# Patient Record
Sex: Female | Born: 1975 | Race: White | Hispanic: Yes | Marital: Single | Smoking: Never smoker
Health system: Southern US, Community
[De-identification: ages and names within clinical notes are randomized; demographics above are authoritative.]

## PROBLEM LIST (undated history)

## (undated) DIAGNOSIS — E669 Obesity, unspecified: Secondary | ICD-10-CM

## (undated) DIAGNOSIS — R7401 Elevation of levels of liver transaminase levels: Secondary | ICD-10-CM

## (undated) DIAGNOSIS — R7303 Prediabetes: Secondary | ICD-10-CM

## (undated) HISTORY — DX: Prediabetes: R73.03

## (undated) HISTORY — DX: Obesity, unspecified: E66.9

## (undated) HISTORY — DX: Elevation of levels of liver transaminase levels: R74.01

## (undated) HISTORY — PX: NO PAST SURGERIES: SHX2092

---

## 2009-01-10 ENCOUNTER — Ambulatory Visit: Payer: Self-pay | Admitting: Internal Medicine

## 2009-01-10 ENCOUNTER — Encounter (INDEPENDENT_AMBULATORY_CARE_PROVIDER_SITE_OTHER): Payer: Self-pay | Admitting: Internal Medicine

## 2009-01-10 LAB — CONVERTED CEMR LAB: Beta hcg, urine, semiquantitative: NEGATIVE

## 2009-01-13 ENCOUNTER — Encounter (INDEPENDENT_AMBULATORY_CARE_PROVIDER_SITE_OTHER): Payer: Self-pay | Admitting: Internal Medicine

## 2009-01-13 LAB — CONVERTED CEMR LAB
Nitrite: NEGATIVE
Specific Gravity, Urine: 1.023 (ref 1.005–1.0)
Urobilinogen, UA: 1 (ref 0.0–1.0)

## 2009-01-15 LAB — CONVERTED CEMR LAB
ALT: 12 U/L
AST: 8 U/L
Albumin: 4.6 g/dL
Alkaline Phosphatase: 90 U/L
BUN: 12 mg/dL
Basophils Absolute: 0 K/uL
Basophils Relative: 0 %
CO2: 25 meq/L
Calcium: 9.4 mg/dL
Chloride: 105 meq/L
Creatinine, Ser: 0.71 mg/dL
Eosinophils Absolute: 0.2 K/uL
Eosinophils Relative: 1 %
Free T4: 1.2 ng/dL
Glucose, Bld: 80 mg/dL
HCT: 40.4 %
Hemoglobin: 13.5 g/dL
Lymphocytes Relative: 17 %
Lymphs Abs: 2.5 K/uL
MCHC: 33.4 g/dL
MCV: 79.8 fL
Monocytes Absolute: 1.1 K/uL — ABNORMAL HIGH
Monocytes Relative: 7 %
Neutro Abs: 11 K/uL — ABNORMAL HIGH
Neutrophils Relative %: 74 %
Platelets: 229 K/uL
Potassium: 3.6 meq/L
RBC: 5.06 M/uL
RDW: 14.2 %
Sodium: 142 meq/L
TSH: 1.227 u[IU]/mL
Total Bilirubin: 0.3 mg/dL
Total Protein: 7.3 g/dL
WBC: 14.9 10*3/microliter — ABNORMAL HIGH

## 2009-05-12 ENCOUNTER — Ambulatory Visit: Payer: Self-pay | Admitting: Internal Medicine

## 2009-07-03 ENCOUNTER — Ambulatory Visit: Payer: Self-pay | Admitting: Internal Medicine

## 2009-07-03 ENCOUNTER — Encounter (INDEPENDENT_AMBULATORY_CARE_PROVIDER_SITE_OTHER): Payer: Self-pay | Admitting: Internal Medicine

## 2009-07-03 LAB — CONVERTED CEMR LAB
Eosinophils Absolute: 0.1 10*3/uL (ref 0.0–0.7)
HDL: 56 mg/dL (ref >39)
Lymphocytes Relative: 27 % (ref 12–46)
Lymphs Abs: 2.2 10*3/uL (ref 0.7–4.0)
Neutrophils Relative %: 65 % (ref 43–77)
Platelets: 218 10*3/uL (ref 150–400)
Triglycerides: 47 mg/dL (ref <150)
WBC: 8.1 10*3/uL (ref 4.0–10.5)

## 2009-07-17 ENCOUNTER — Ambulatory Visit: Payer: Self-pay | Admitting: Internal Medicine

## 2010-11-14 ENCOUNTER — Emergency Department (HOSPITAL_COMMUNITY)
Admission: EM | Admit: 2010-11-14 | Discharge: 2010-11-14 | Disposition: A | Discharge status: Home / Self Care | Payer: Self-pay | Attending: Emergency Medicine | Admitting: Emergency Medicine

## 2010-11-14 DIAGNOSIS — T6391XA Toxic effect of contact with unspecified venomous animal, accidental (unintentional), initial encounter: Secondary | ICD-10-CM | POA: Insufficient documentation

## 2010-11-14 DIAGNOSIS — T63391A Toxic effect of venom of other spider, accidental (unintentional), initial encounter: Secondary | ICD-10-CM | POA: Insufficient documentation

## 2010-11-14 DIAGNOSIS — L02419 Cutaneous abscess of limb, unspecified: Secondary | ICD-10-CM | POA: Insufficient documentation

## 2013-05-14 ENCOUNTER — Encounter: Payer: Self-pay | Admitting: *Deleted

## 2013-06-14 ENCOUNTER — Ambulatory Visit (INDEPENDENT_AMBULATORY_CARE_PROVIDER_SITE_OTHER): Payer: Self-pay | Admitting: Obstetrics & Gynecology

## 2013-06-14 ENCOUNTER — Encounter: Payer: Self-pay | Admitting: Obstetrics & Gynecology

## 2013-06-14 VITALS — BP 100/70 | HR 62 | Temp 97.7°F | Ht 63.0 in | Wt 136.0 lb

## 2013-06-14 DIAGNOSIS — N938 Other specified abnormal uterine and vaginal bleeding: Secondary | ICD-10-CM

## 2013-06-14 DIAGNOSIS — N949 Unspecified condition associated with female genital organs and menstrual cycle: Secondary | ICD-10-CM

## 2013-06-14 LAB — POCT PREGNANCY, URINE: Preg Test, Ur: NEGATIVE

## 2013-06-14 MED ORDER — MEGESTROL ACETATE 20 MG PO TABS: 40.0000 mg | ORAL_TABLET | Freq: Every day | ORAL | Status: DC

## 2013-06-14 NOTE — Progress Notes (Signed)
Subjective:     Patient ID: Heriberto AntiguaDora Hernandez Contreras, female   DOB: January 26, 1976, 38 y.o.   MRN: [id number]  HPI G1P1001.  Pt c/o irreg bleeding since Oct. She denies pain but her regular cycle was 5 day until this began. N o weight loss or constitutional sx.   History reviewed. No pertinent past medical history.  History reviewed. No pertinent past surgical history. No current outpatient prescriptions on file prior to visit.   No current facility-administered medications on file prior to visit.  Not on File History   Social History  . Marital Status: Single    Spouse Name: N/A    Number of Children: N/A  . Years of Education: N/A   Occupational History  . Not on file.   Social History Main Topics  . Smoking status: Never Smoker   . Smokeless tobacco: Not on file  . Alcohol Use: No  . Drug Use: No  . Sexual Activity: Yes    Birth Control/ Protection: Condom   Other Topics Concern  . Not on file   Social History Narrative  . No narrative on file      Review of Systems     Objective:   Physical Exam BP 100/70  Pulse 62  Temp(Src) 97.7 F (36.5 C)  Ht 5\' 3"  (1.6 m)  Wt 136 lb (61.689 kg)  BMI 24.10 kg/m2  LMP 05/28/2013 Pt in NAD Abd: soft, NT, ND GU: EGBUS: no lesions Vagina: no blood in vault Cervix: no lesion; no mucopurulent d/c Uterus: small ~10 weeks sized, mobile Adnexa: no masses; sl tender          Assessment:     DUB      Plan:     D/C OCP's TV sono Megace 40mg  daily If bleeding persists after 2 weeks may need to increase dose

## 2013-06-14 NOTE — Patient Instructions (Addendum)
Hemorragia uterina disfuncional (Dysfunctional Uterine Bleeding) Normalmente, los perodos menstruales comienzan en las jvenes de entre 11 y 17 aos. Un ciclo o perodo menstrual puede repetirse entre los 23 das y los 35 das y dura entre 1 y 7 das. Entre los 12 y los 14 das antes de que comience el ciclo menstrual, se produce la ovulacin (los ovarios producen vulos). Cuando cuente los periodos, hgalo desde el primer da del comienzo de la hemorragia del perodo anterior hasta el primer da de la hemorragia del perodo siguiente. La hemorragia uterina disfuncional (anormal) es diferente del perodo menstrual normal. Los perodos pueden comenzar antes o despus que lo habitual. Pueden ser menos abundantes, presentar cogulos, o ser ms abundantes. Puede tener hemorragias entre los perodos o saltear un perodo o ms. Puede tener hemorragia luego de mantener relaciones sexuales, despus de la menopausia o faltarle el perodo menstrual. CAUSAS  Embarazo (normal, aborto espontneo, embarazo ectpico).  DIU (dispositivo intrauterino, anticonceptivo)  Pldoras anticonceptivas  Tratamiento hormonal  La menopausia  Infeccin en el cervix.  Problemas de coagulacin.  Infecciones de la superficie interna del tero.  Endometriosis: la superficie interna del tero se desarrolla en la pelvis y otros rganos femeninos.  Adherencias (tejido cicatrizal) dentro del tero.  Obesidad o severa prdida de peso.  Plipos en el tero.  Cncer en el crvix, la vagina o el tero.  Quiste de ovarios o Sndrome ovrico poliqustico.  Otras enfermedades (diabetes, enfermedad de la tiroides, etc).  Fibromas en el tero (tumores no cancerosos).  Problemas con las hormonas femeninas.  Hiperplasia del endometrio: capa gruesa y clulas agrandadas dentro del tero.  Medicamentos que interfieren con la ovulacin.  Radiacin en la pelvis o el abdomen.  Quimioterapia. DIAGNSTICO  El mdico  analizar la historia de sus perodos menstruales, los medicamentos que toma, los cambios en el peso corporal, el estrs de la vida diaria y cualquier problema mdico que tenga.  El mdico realizar un examen fsico, incluyendo un examen plvico.  Tambin le solicitar anlisis para completar el diagnstico. Ellos son:  Papanicolau.  Anlisis de sangre.  Cultivos para descartar infecciones.  Tomografa computada.  Ultrasonido.  Histeroscopa.  Laparoscopa.  Resonancia magntica.  Histerosalpingografa.  Dilatacin y curetaje.  Biopsia de endometrio. TRATAMIENTO El tratamiento depender de la causa de la hemorragia.. El tratamiento consiste en:  Observacin de los perodos menstruales durante algunos meses.  Prescripcin de medicamentos como:  Antibiticos:  Hormonas.  Pldoras anticonceptivas  Retirar el DIU (dispositivo intrauterino, anticonceptivo).  Ciruga:  Dilatacin y curetaje (raspado y remocin de tejido de la zona interna del tero).  Laparoscopa (examen del interior del abdomen con un tubo con luz).  Ablacin uterina (destruccin de la membrana que cubre el tero con corriente elctrica, rayos lser, congelamiento o calor ).  Histeroscopa (examen del cuello y el tero con un tubo con luz).  Histerectoma (extirpacin del tero). INSTRUCCIONES PARA EL CUIDADO DOMICILIARIO  Si el profesional que la asiste le prescribe medicamentos, tmelos tal como se le indic. No cambie ni reemplace medicamentos sin consultarlo con el profesional.  Las hemorragias de larga duracin pueden traen como consecuencia un dficit de hierro. El profesional que lo asiste podr prescribirle hierro. Esto ayuda a reponer el hierro que el organismo pierde luego de una hemorragia abundante. Tome los medicamentos tal como se le indic.  No tome aspirina o medicamentos que la contengan u desde una semana antes del perodo menstrual ni durante el mismo. La aspirina puede hacer  que la hemorragia empeore.    Si necesita cambiar el apsito o el tampn mas de una vez cada 2 horas, permanezca en cama con los pies elevados y aplique una compresa fra en la zona baja del abdomen. Descanse todo lo que pueda hasta que la hemorragia se detenga.  Consuma alimentos balanceados. Coma alimentos ricos en hierro. Por ejemplo:  Vegetales verdes frescos.  Cereales integrales y cereales y panes con salvado.  Huevos.  Carnes.  Hgado.  No trate de perder peso hasta que la hemorragia anormal se detenga y los niveles de hierro en la sangre vuelvan a la normalidad. No levante pesos de ms de 10 libras (4.5 kg.) ni realice actividades extenuantes mientras tenga la hemorragia.  Durante un par de meses tome nota en un almanaque y marque el comienzo y el fin de su perodo menstrual y el tipo de sangrado (escaso, medio, abundante, gotas, cogulos o falta de perodo). El objetivo es que su mdico evale mejor el problema. SOLICITE ATENCIN MDICA SI:  Debe cambiar el apsito o el tampn ms de una vez cada hora.  Se siente mareada o dbil.  Tiene algn problema que pueda relacionarse con el medicamento que est tomando.  Siente dolor.  Desea retirar su DIU.  Quiere suspender o cambiar sus pldoras anticonceptivas u hormonas.  Tiene algn tipo de hemorragia anormal mencionada anteriormente.  Tiene ms de 16 aos y an no ha tenido su perodo menstrual.  Tiene 55 aos y an tiene perodos menstruales.  Tiene alguno de los sntomas mencionados anteriormente.  Aparece una erupcin cutnea. SOLICITE ATENCIN MDICA DE INMEDIATO SI:  La temperatura oral se eleva sin motivo por encima de 38,9 C (102 F).  Comienza a sentir escalofros.  Debe cambiar el apsito o el tampn ms de una vez cada hora.  Siente dolor abdominal.  Pierde el conocimiento, se desmaya. Document Released: 12/30/2004 Document Revised: 12/15/2011 [employer] Patient Information 2014 [employer].  

## 2013-06-26 ENCOUNTER — Ambulatory Visit (HOSPITAL_COMMUNITY)
Admission: RE | Admit: 2013-06-26 | Discharge: 2013-06-26 | Disposition: A | Discharge status: Home / Self Care | Payer: Self-pay | Source: Ambulatory Visit | Attending: Obstetrics & Gynecology | Admitting: Obstetrics & Gynecology

## 2013-06-26 DIAGNOSIS — N938 Other specified abnormal uterine and vaginal bleeding: Secondary | ICD-10-CM | POA: Insufficient documentation

## 2013-06-26 DIAGNOSIS — N949 Unspecified condition associated with female genital organs and menstrual cycle: Secondary | ICD-10-CM | POA: Insufficient documentation

## 2013-07-02 ENCOUNTER — Telehealth: Payer: Self-pay | Admitting: *Deleted

## 2013-07-02 NOTE — Telephone Encounter (Signed)
Message copied by Gerome ApleyZEYFANG, LINDA L on Mon Jul 02, 2013  4:05 PM ------      Message from: Willodean RosenthalHARRAWAY-SMITH, CAROLYN      Created: Thu Jun 28, 2013  3:10 PM       Please call pt.  Her sono was normal.  She should continue the Megace and f/u in 3 months as scheduled.            Thx,      clh-S ------

## 2013-07-02 NOTE — Telephone Encounter (Signed)
Called both patient and her spouse phone numbers, both are non- working numbers. Her number said at her request does not accept incoming calls.

## 2013-07-04 NOTE — Telephone Encounter (Signed)
Attempted to call pt unable to leave message. Sent certified letter.

## 2013-07-23 ENCOUNTER — Ambulatory Visit (INDEPENDENT_AMBULATORY_CARE_PROVIDER_SITE_OTHER): Payer: Self-pay | Admitting: Obstetrics & Gynecology

## 2013-07-23 DIAGNOSIS — N926 Irregular menstruation, unspecified: Secondary | ICD-10-CM

## 2013-07-23 DIAGNOSIS — N939 Abnormal uterine and vaginal bleeding, unspecified: Secondary | ICD-10-CM

## 2013-07-23 NOTE — Progress Notes (Signed)
Patient came in because she was told to make appt and come in if she kept bleeding. Patient reports no change since last visit here- finally stopped bleeding 2 days ago. Discussed with Anyanwu, patient can continue megace and follow up in June as previously discussed. If bleeding picks up patient can increase megace to two pills twice daily for a total of 80mg . Discussed Dr Mont DuttonAnyanwu's recommendations with patient. Patient verbalized understanding to all and knows to call if bleeding becomes heavy again. Patient not seen by doctor today

## 2013-07-24 ENCOUNTER — Encounter: Payer: Self-pay | Admitting: Obstetrics & Gynecology

## 2013-07-24 DIAGNOSIS — N939 Abnormal uterine and vaginal bleeding, unspecified: Secondary | ICD-10-CM | POA: Insufficient documentation

## 2013-07-24 NOTE — Patient Instructions (Signed)
Regrese a la clinica cuando tenga su cita. Si tiene problemas o preguntas, llama a la clinica o vaya a la sala [facility] emergencia al [facility].    

## 2013-08-29 ENCOUNTER — Encounter: Payer: Self-pay | Admitting: General Practice

## 2013-08-31 ENCOUNTER — Encounter: Payer: Self-pay | Admitting: General Practice

## 2013-12-03 ENCOUNTER — Emergency Department (HOSPITAL_COMMUNITY): Payer: Self-pay

## 2013-12-03 ENCOUNTER — Emergency Department (HOSPITAL_COMMUNITY)
Admission: EM | Admit: 2013-12-03 | Discharge: 2013-12-03 | Disposition: A | Discharge status: Home / Self Care | Payer: Self-pay | Attending: Emergency Medicine | Admitting: Emergency Medicine

## 2013-12-03 ENCOUNTER — Encounter (HOSPITAL_COMMUNITY): Payer: Self-pay | Admitting: Emergency Medicine

## 2013-12-03 DIAGNOSIS — R300 Dysuria: Secondary | ICD-10-CM | POA: Insufficient documentation

## 2013-12-03 DIAGNOSIS — R1030 Lower abdominal pain, unspecified: Secondary | ICD-10-CM

## 2013-12-03 DIAGNOSIS — Z3490 Encounter for supervision of normal pregnancy, unspecified, unspecified trimester: Secondary | ICD-10-CM

## 2013-12-03 DIAGNOSIS — O9989 Other specified diseases and conditions complicating pregnancy, childbirth and the puerperium: Secondary | ICD-10-CM | POA: Insufficient documentation

## 2013-12-03 DIAGNOSIS — R197 Diarrhea, unspecified: Secondary | ICD-10-CM | POA: Insufficient documentation

## 2013-12-03 DIAGNOSIS — R109 Unspecified abdominal pain: Secondary | ICD-10-CM | POA: Insufficient documentation

## 2013-12-03 LAB — CBC WITH DIFFERENTIAL/PLATELET
BASOS PCT: 0 % (ref 0–1)
Basophils Absolute: 0 10*3/uL (ref 0.0–0.1)
EOS ABS: 0.2 10*3/uL (ref 0.0–0.7)
Eosinophils Relative: 2 % (ref 0–5)
HEMATOCRIT: 35.8 % — AB (ref 36.0–46.0)
HEMOGLOBIN: 12.3 g/dL (ref 12.0–15.0)
LYMPHS ABS: 2.3 10*3/uL (ref 0.7–4.0)
Lymphocytes Relative: 22 % (ref 12–46)
MCH: 26.7 pg (ref 26.0–34.0)
MCHC: 34.4 g/dL (ref 30.0–36.0)
MCV: 77.8 fL — AB (ref 78.0–100.0)
MONO ABS: 0.8 10*3/uL (ref 0.1–1.0)
Monocytes Relative: 8 % (ref 3–12)
Neutro Abs: 7.4 10*3/uL (ref 1.7–7.7)
Neutrophils Relative %: 68 % (ref 43–77)
Platelets: 211 10*3/uL (ref 150–400)
RBC: 4.6 MIL/uL (ref 3.87–5.11)
RDW: 14.1 % (ref 11.5–15.5)
WBC: 10.8 10*3/uL — ABNORMAL HIGH (ref 4.0–10.5)

## 2013-12-03 LAB — COMPREHENSIVE METABOLIC PANEL
ALBUMIN: 3.2 g/dL — AB (ref 3.5–5.2)
ALK PHOS: 78 U/L (ref 39–117)
ALT: 11 U/L (ref 0–35)
AST: 8 U/L (ref 0–37)
Anion gap: 13 (ref 5–15)
BUN: 8 mg/dL (ref 6–23)
CO2: 22 mEq/L (ref 19–32)
Calcium: 8.9 mg/dL (ref 8.4–10.5)
Chloride: 104 mEq/L (ref 96–112)
Creatinine, Ser: 0.56 mg/dL (ref 0.50–1.10)
GFR calc non Af Amer: >90 mL/min (ref >90)
GLUCOSE: 117 mg/dL — AB (ref 70–99)
POTASSIUM: 3.5 meq/L — AB (ref 3.7–5.3)
Sodium: 139 mEq/L (ref 137–147)
TOTAL PROTEIN: 6.9 g/dL (ref 6.0–8.3)

## 2013-12-03 LAB — URINALYSIS, ROUTINE W REFLEX MICROSCOPIC
BILIRUBIN URINE: NEGATIVE
Glucose, UA: NEGATIVE mg/dL
Hgb urine dipstick: NEGATIVE
KETONES UR: NEGATIVE mg/dL
LEUKOCYTES UA: NEGATIVE
Nitrite: NEGATIVE
Protein, ur: NEGATIVE mg/dL
Specific Gravity, Urine: 1.02 (ref 1.005–1.030)
UROBILINOGEN UA: 0.2 mg/dL (ref 0.0–1.0)
pH: 6 (ref 5.0–8.0)

## 2013-12-03 LAB — POC URINE PREG, ED
Preg Test, Ur: POSITIVE — AB
Preg Test, Ur: POSITIVE — AB

## 2013-12-03 MED ORDER — ACETAMINOPHEN 500 MG PO TABS: 500.0000 mg | ORAL_TABLET | Freq: Four times a day (QID) | ORAL | Status: DC | PRN

## 2013-12-03 MED ORDER — ACETAMINOPHEN 325 MG PO TABS
325.0000 mg | ORAL_TABLET | Freq: Once | ORAL | Status: AC
  Administered 2013-12-03: 325 mg via ORAL
  Filled 2013-12-03: qty 1

## 2013-12-03 NOTE — Discharge Instructions (Signed)
Please follow up with your primary care physician in 1-2 days. If you do not have one please call the [facility] number listed above. Please follow up with the Ob/Gyn tomorrow at your scheduled appointment. Please read all discharge instructions and return precautions.    Dolor abdominal en las mujeres (Abdominal Pain, Women) El dolor abdominal (en el estmago, la pelvis o el vientre) puede tener muchas causas. Es importante que le informe a su mdico:  La ubicacin del Engineer, mining.  Viene y se va, o persiste todo el tiempo?  Hay situaciones que Location manager (comer ciertos alimentos, la actividad fsica)?  Tiene otros sntomas asociados al dolor (fiebre, nuseas, vmitos, diarrea)? Todo es de gran ayuda cuando se trata de hallar la causa del dolor. CAUSAS  Estmago: Infecciones por virus o bacterias, o lcera.  Intestino: Apendicitis (apndice inflamado), ileitis regional (enfermedad de Crohn), colitis ulcerosa (colon inflamado), sndrome del colon irritable, diverticulitis (inflamacin de los divertculos del colon) o cncer de estmago oo intestino.  Enfermedades de la vescula biliar o clculos.  Enfermedades renales, clculos o infecciones en el rin.  Infeccin o cncer del pncreas.  Fibromialgia (trastorno doloroso)  Enfermedades de los rganos femeninos:  Uterus: tero: fibroma (tumor no canceroso) o infeccin  Trompas de [place]op[place]: infeccin o embarazo ectpico  En los ovarios, quistes o tumores.  Adherencias plvicas (tejido cicatrizal).  Endometriosis (el tejido que cubre el tero se desarrolla en la pelvis y los rganos plvicos).  Sndrome de Agricultural engineer (los rganos femeninos se llenan de sangre antes del periodo menstrual(  Dolor durante el periodo menstrual.  Dolor durante la ovulacin (al producir vulos).  Dolor al usar el DIU (dispositivo intrauterino para el control de la natalidad)  Psychologist, clinical los rganos  femeninos.  Dolor funcional (no est originado en una enfermedad, puede mejorar sin tratamiento).  Dolor de origen psicolgico  Depresin. DIAGNSTICO Su mdico decidir la gravedad del dolor a travs del examen fsico  Anlisis de sangre  Radiografas  Ecografas  TC (tomografa computada, tipo especial de radiografas).  IMR (resonancia magntica)  Cultivos, en el caso una infeccin  Colon por enema de bario (se inserta una sustancia de contraste en el intestino grueso para mejorar la observacin con rayos X.)  Colonoscopa (observacin del intestino con un tubo luminoso).  Laparoscopa (examen del interior del abdomen con un tubo que tiene [employer]).  Ciruga exploratoria abdominal mayor (se observa el abdomen realizando una gran incisin). TRATAMIENTO El tratamiento depender de la causa del problema.   Muchos de estos casos pueden controlarse y tratarse en casa.  Medicamentos de venta libre indicados por el mdico.  Medicamentos con receta.  Antibiticos, en caso de infeccin  Pldoras anticonceptivas, en el caso de perodos dolorosos o dolor al ovular.  Tratamiento hormonal, para la endometriosis  Inyecciones para bloqueo nervioso selectivo.  Fisioterapia.  Antidepresivos.  Consejos por parte de un psclogo o psiquiatra.  Ciruga mayor o menor. INSTRUCCIONES PARA EL CUIDADO DOMICILIARIO  No tome ni administre laxantes a menos que se lo haya indicado su mdico.  Tome analgsicos de venta libre slo si se lo ha indicado el profesional que lo asiste. No tome aspirina, ya que puede causar [employer] o hemorragias.  Consuma una dieta lquida (caldo o agua) segn lo indicado por el mdico. Progrese lentamente a una dieta blanda, segn la tolerancia, si el dolor se relaciona con el estmago o el intestino.  Tenga un termmetro y Automotive engineer varias  veces al da.  Haga reposo en la cama y [place], si esto Research scientist (life sciences).  Evite las  relaciones sexuales, Counsellor.  Evite las situaciones estresantes.  Cumpla con las visitas y los anlisis de control, segn las indicaciones de su mdico.  Si el dolor no se [place] con los medicamentos o la [place], [place] tratar con:  Acupuntura.  Ejercicios de relajacin (yoga, meditacin).  Terapia grupal.  Psicoterapia. SOLICITE ATENCIN MDICA SI:  Nota que ciertos Pharmacist, community de [place].  El tratamiento indicado para Arboriculturist no Marketing executive.  Necesita analgsicos ms fuertes.  Quiere que le retiren el DIU.  Si se siente confundido o desfalleciente.  Presenta nuseas o vmitos.  Aparece una erupcin cutnea.  Sufre efectos adversos o una reaccin alrgica debido a los medicamentos que toma. SOLICITE ATENCIN MDICA DE INMEDIATO SI:  El dolor persiste o se agrava.  Tiene fiebre.  Siente el dolor slo en algunos sectores del abdomen. Si se localiza en la zona derecha, posiblemente podra tratarse de apendicitis. En un adulto, si se localiza en la regin inferior izquierda del abdomen, podra tratarse de colitis o diverticulitis.  Hay sangre en las heces (deposiciones de color rojo brillante o negro alquitranado), con o sin vmitos.  Usted presenta sangre en la orina.  Siente escalofros con o sin fiebre.  Se desmaya. ASEGRESE QUE:   Comprende estas instrucciones.  Controlar su enfermedad.  Solicitar ayuda de inmediato si no mejora o si empeora. Document Released: 07/08/2008 Document Revised: 06/14/2011 [facility] Patient Information 2015 [place], [place]. This information is not intended to replace advice given to you by your health care provider. Make sure you discuss any questions you have with your health care provider.

## 2013-12-03 NOTE — ED Provider Notes (Signed)
CSN: [id number]     Arrival date & time 12/03/13  1837 History   First MD Initiated Contact with Patient 12/03/13 2000     Chief Complaint  Patient presents with  . Abdominal Pain     (Consider location/radiation/quality/duration/timing/severity/associated sxs/prior Treatment) HPI Comments: Patient is a G29 P39 38 year old female presented to the emergency department for acute onset lower abdominal pain that began one hour prior to arrival. Patient described her pain as sharp cramping pressure that has improved off since having an episode of nonbloody diarrhea. Also endorses mild dysuria. Denies any fevers, chills, nausea, vomiting, vaginal bleeding or discharge. No abdominal surgical history. Patient is scheduled for OB/GYN followup tomorrow morning.  Patient is a 39 y.o. female presenting with abdominal pain. The history is provided by the patient. The history is limited by a language barrier. A language interpreter was used.  Abdominal Pain Associated symptoms: diarrhea   Associated symptoms: no chills, no fever, no nausea and no vomiting     History reviewed. No pertinent past medical history. History reviewed. No pertinent past surgical history. Family History  Problem Relation Age of Onset  . Hyperlipidemia Father    History  Substance Use Topics  . Smoking status: Never Smoker   . Smokeless tobacco: Not on file  . Alcohol Use: No   OB History   Grav Para Term Preterm Abortions TAB SAB Ect Mult Living   4              Review of Systems  Constitutional: Negative for fever and chills.  Gastrointestinal: Positive for abdominal pain and diarrhea. Negative for nausea and vomiting.  Genitourinary: Negative.   All other systems reviewed and are negative.     Allergies  Review of patient's allergies indicates no known allergies.  Home Medications   Prior to Admission medications   Medication Sig Start Date End Date Taking? Authorizing Provider  acetaminophen (TYLENOL)  500 MG tablet Take 1 tablet (500 mg total) by mouth every 6 (six) hours as needed. 12/03/13   Nancylee Gaines L Borna Wessinger, PA-C   BP 104/62  Pulse 72  Temp(Src) 97.9 F (36.6 C) (Oral)  Resp 17  Wt 141 lb 8 oz (64.184 kg)  SpO2 99%  LMP 05/28/2013 Physical Exam  Nursing note and vitals reviewed. Constitutional: She is oriented to person, place, and time. She appears well-developed and well-nourished. No distress.  HENT:  Head: Normocephalic and atraumatic.  Right Ear: External ear normal.  Left Ear: External ear normal.  Nose: Nose normal.  Mouth/Throat: Oropharynx is clear and moist. No oropharyngeal exudate.  Eyes: Conjunctivae are normal.  Neck: Normal range of motion. Neck supple.  Cardiovascular: Normal rate, regular rhythm and normal heart sounds.   Pulmonary/Chest: Effort normal and breath sounds normal. No respiratory distress.  Abdominal: Soft. Bowel sounds are normal. She exhibits no distension. There is no tenderness. There is no rebound and no guarding.  Musculoskeletal: Normal range of motion.  Neurological: She is alert and oriented to person, place, and time.  Skin: Skin is warm and dry. She is not diaphoretic.  Psychiatric: She has a normal mood and affect.    ED Course  Procedures (including critical care time) Medications  acetaminophen (TYLENOL) tablet 325 mg (325 mg Oral Given 12/03/13 2021)    Labs Review Labs Reviewed  CBC WITH DIFFERENTIAL - Abnormal; Notable for the following:    WBC 10.8 (*)    HCT 35.8 (*)    MCV 77.8 (*)    All  other components within normal limits  COMPREHENSIVE METABOLIC PANEL - Abnormal; Notable for the following:    Potassium 3.5 (*)    Glucose, Bld 117 (*)    Albumin 3.2 (*)    Total Bilirubin <0.2 (*)    All other components within normal limits  POC URINE PREG, ED - Abnormal; Notable for the following:    Preg Test, Ur POSITIVE (*)    All other components within normal limits  POC URINE PREG, ED - Abnormal; Notable for  the following:    Preg Test, Ur POSITIVE (*)    All other components within normal limits  URINE CULTURE  URINALYSIS, ROUTINE W REFLEX MICROSCOPIC    Imaging Review US Ob Comp + 14 Wk  12/03/2013   CLINICAL DATA:  Abdominal pain, 14 weeks 5 days pregnant.  EXAM: LIMITED OBSTETRIC ULTRASOUND  FINDINGS: Number of Fetuses: 1  Heart Rate:  153 bpm  Movement: Yes  Presentation: Variable  Placental Location: Anterior  Previa: No  Amniotic Fluid (Subjective):  Within normal limits.  BPD:  27.6cm 14w  6d  MATERNAL FINDINGS:  Cervix:  Appears closed.  Uterus/Adnexae:  Ovaries not visualized.  IMPRESSION: Intrauterine pregnancy with estimated age of 14 weeks 6 days by BPD.  Ovaries not visualized.  This exam is performed on an emergent basis and does not comprehensively evaluate fetal size, dating, or anatomy; follow-up complete OB US should be considered if further fetal assessment is warranted.   Electronically Signed   By: Jearld Lesch M.D.   On: 12/03/2013 21:29     EKG Interpretation None      MDM   Final diagnoses:  Lower abdominal pain  Pregnancy   Filed Vitals:   12/03/13 2200  BP: 104/62  Pulse: 72  Temp:   Resp: 17   Afebrile, NAD, non-toxic appearing, AAOx4.   Abdomen is soft, Nontender, nondistended. Denies any pelvic pain, vaginal bleeding or discharge. I have reviewed nursing notes, vital signs, and all appropriate lab and imaging results for this patient. Patient is pain-free at this time. Symptoms likely related to episode of diarrhea earlier. 5 patient to keep OB/GYN followup appointment tomorrow as prescribed. Return precautions were discussed. Patient is agreeable to plan. Patient stable at time of discharge.     Jeannetta Ellis, PA-C 12/03/13 2248

## 2013-12-03 NOTE — ED Notes (Signed)
Pt reports having lower abd pain x 1 hour. Pt is pregnant but unsure when her last period was, she is estimating being 3-4 months pregnant. Denies any vaginal bleeding or discharge but is having  Mild pain with urination. Having diarrhea, denies n/v.

## 2013-12-04 NOTE — ED Provider Notes (Signed)
Medical screening examination/treatment/procedure(s) were performed by non-physician practitioner and as supervising physician I was immediately available for consultation/collaboration.   EKG Interpretation None        Gilda Crease, MD 12/04/13 [phone]

## 2013-12-05 LAB — URINE CULTURE
Colony Count: NO GROWTH
Culture: NO GROWTH

## 2014-01-17 ENCOUNTER — Other Ambulatory Visit (HOSPITAL_COMMUNITY): Payer: Self-pay | Admitting: Physician Assistant

## 2014-01-17 DIAGNOSIS — Z3689 Encounter for other specified antenatal screening: Secondary | ICD-10-CM

## 2014-01-17 LAB — OB RESULTS CONSOLE RPR: RPR: NONREACTIVE

## 2014-01-17 LAB — OB RESULTS CONSOLE GC/CHLAMYDIA
CHLAMYDIA, DNA PROBE: NEGATIVE
Gonorrhea: NEGATIVE

## 2014-01-17 LAB — OB RESULTS CONSOLE ABO/RH: RH Type: POSITIVE

## 2014-01-17 LAB — OB RESULTS CONSOLE HEPATITIS B SURFACE ANTIGEN: Hepatitis B Surface Ag: NEGATIVE

## 2014-01-17 LAB — OB RESULTS CONSOLE RUBELLA ANTIBODY, IGM: RUBELLA: IMMUNE

## 2014-01-17 LAB — OB RESULTS CONSOLE HIV ANTIBODY (ROUTINE TESTING): HIV: NONREACTIVE

## 2014-01-17 LAB — OB RESULTS CONSOLE ANTIBODY SCREEN: Antibody Screen: NEGATIVE

## 2014-01-22 ENCOUNTER — Other Ambulatory Visit (HOSPITAL_COMMUNITY): Payer: Self-pay | Admitting: Physician Assistant

## 2014-01-22 ENCOUNTER — Ambulatory Visit (HOSPITAL_COMMUNITY)
Admission: RE | Admit: 2014-01-22 | Discharge: 2014-01-22 | Disposition: A | Discharge status: Home / Self Care | Payer: Self-pay | Source: Ambulatory Visit | Attending: Physician Assistant | Admitting: Physician Assistant

## 2014-01-22 DIAGNOSIS — O09522 Supervision of elderly multigravida, second trimester: Secondary | ICD-10-CM | POA: Insufficient documentation

## 2014-01-22 DIAGNOSIS — Z3A00 Weeks of gestation of pregnancy not specified: Secondary | ICD-10-CM | POA: Insufficient documentation

## 2014-01-22 DIAGNOSIS — Z3689 Encounter for other specified antenatal screening: Secondary | ICD-10-CM

## 2014-01-22 DIAGNOSIS — Z36 Encounter for antenatal screening of mother: Secondary | ICD-10-CM | POA: Insufficient documentation

## 2014-02-04 ENCOUNTER — Encounter (HOSPITAL_COMMUNITY): Payer: Self-pay | Admitting: Emergency Medicine

## 2014-04-05 NOTE — L&D Delivery Note (Signed)
Delivery Note At 1:43 AM a viable and healthy female was delivered via  (Presentation: ROA;  ).  APGAR: 9, 9; weight 3515g.   Placenta status: Intact, Spontaneous.  Cord: 3 vessel with the following complications: none.   Anesthesia:  epidural Episiotomy:  none Lacerations:  none Suture Repair: n/a Est. Blood Loss (mL):  250  Healthy female infant delivered over intact perineum, vigorous cry, placed on mothers abdomen.  Mom to postpartum.  Baby to Couplet care / Skin to Skin.  Beverely Lowdamo, Drew Lips 05/25/2014, 1:54 AM

## 2014-05-02 LAB — OB RESULTS CONSOLE GBS: GBS: NEGATIVE

## 2014-05-24 ENCOUNTER — Inpatient Hospital Stay (HOSPITAL_COMMUNITY): Payer: Medicaid Other | Admitting: Anesthesiology

## 2014-05-24 ENCOUNTER — Inpatient Hospital Stay (HOSPITAL_COMMUNITY)
Admission: AD | Admit: 2014-05-24 | Discharge: 2014-05-26 | DRG: 775 | Disposition: A | Discharge status: Home / Self Care | Payer: Medicaid Other | Source: Ambulatory Visit | Attending: Obstetrics & Gynecology | Admitting: Obstetrics & Gynecology

## 2014-05-24 ENCOUNTER — Encounter (HOSPITAL_COMMUNITY): Payer: Self-pay | Admitting: *Deleted

## 2014-05-24 DIAGNOSIS — IMO0001 Reserved for inherently not codable concepts without codable children: Secondary | ICD-10-CM

## 2014-05-24 DIAGNOSIS — Z3483 Encounter for supervision of other normal pregnancy, third trimester: Secondary | ICD-10-CM | POA: Diagnosis present

## 2014-05-24 DIAGNOSIS — Z3A39 39 weeks gestation of pregnancy: Secondary | ICD-10-CM | POA: Diagnosis present

## 2014-05-24 LAB — CBC
HCT: 39.9 % (ref 36.0–46.0)
Hemoglobin: 13.7 g/dL (ref 12.0–15.0)
MCH: 28.2 pg (ref 26.0–34.0)
MCHC: 34.3 g/dL (ref 30.0–36.0)
MCV: 82.1 fL (ref 78.0–100.0)
PLATELETS: 177 10*3/uL (ref 150–400)
RBC: 4.86 MIL/uL (ref 3.87–5.11)
RDW: 15.6 % — ABNORMAL HIGH (ref 11.5–15.5)
WBC: 19.7 10*3/uL — ABNORMAL HIGH (ref 4.0–10.5)

## 2014-05-24 LAB — TYPE AND SCREEN
ABO/RH(D): O POS
ANTIBODY SCREEN: NEGATIVE

## 2014-05-24 MED ORDER — FENTANYL 2.5 MCG/ML BUPIVACAINE 1/10 % EPIDURAL INFUSION (WH - ANES)
INTRAMUSCULAR | Status: DC | PRN
  Administered 2014-05-24: 14 mL/h via EPIDURAL

## 2014-05-24 MED ORDER — LIDOCAINE HCL (PF) 1 % IJ SOLN
30.0000 mL | INTRAMUSCULAR | Status: DC | PRN
  Filled 2014-05-24: qty 30

## 2014-05-24 MED ORDER — OXYTOCIN 40 UNITS IN LACTATED RINGERS INFUSION - SIMPLE MED
62.5000 mL/h | INTRAVENOUS | Status: DC
  Filled 2014-05-24: qty 1000

## 2014-05-24 MED ORDER — LACTATED RINGERS IV SOLN
500.0000 mL | Freq: Once | INTRAVENOUS | Status: AC
  Administered 2014-05-24: 500 mL via INTRAVENOUS

## 2014-05-24 MED ORDER — OXYCODONE-ACETAMINOPHEN 5-325 MG PO TABS: 2.0000 | ORAL_TABLET | ORAL | Status: DC | PRN

## 2014-05-24 MED ORDER — EPHEDRINE 5 MG/ML INJ
10.0000 mg | INTRAVENOUS | Status: DC | PRN
  Filled 2014-05-24: qty 2

## 2014-05-24 MED ORDER — FENTANYL CITRATE 0.05 MG/ML IJ SOLN
100.0000 ug | INTRAMUSCULAR | Status: DC | PRN
  Administered 2014-05-24 (×2): 100 ug via INTRAVENOUS
  Filled 2014-05-24 (×2): qty 2

## 2014-05-24 MED ORDER — PHENYLEPHRINE 40 MCG/ML (10ML) SYRINGE FOR IV PUSH (FOR BLOOD PRESSURE SUPPORT)
80.0000 ug | PREFILLED_SYRINGE | INTRAVENOUS | Status: DC | PRN
  Filled 2014-05-24: qty 2

## 2014-05-24 MED ORDER — PHENYLEPHRINE 40 MCG/ML (10ML) SYRINGE FOR IV PUSH (FOR BLOOD PRESSURE SUPPORT)
80.0000 ug | PREFILLED_SYRINGE | INTRAVENOUS | Status: DC | PRN
  Filled 2014-05-24: qty 2
  Filled 2014-05-24: qty 20

## 2014-05-24 MED ORDER — LACTATED RINGERS IV SOLN
INTRAVENOUS | Status: DC
  Administered 2014-05-24 (×2): via INTRAVENOUS

## 2014-05-24 MED ORDER — DIPHENHYDRAMINE HCL 50 MG/ML IJ SOLN: 12.5000 mg | INTRAMUSCULAR | Status: DC | PRN

## 2014-05-24 MED ORDER — FENTANYL 2.5 MCG/ML BUPIVACAINE 1/10 % EPIDURAL INFUSION (WH - ANES)
14.0000 mL/h | INTRAMUSCULAR | Status: DC | PRN
  Administered 2014-05-24: 14 mL/h via EPIDURAL
  Filled 2014-05-24: qty 125

## 2014-05-24 MED ORDER — ONDANSETRON HCL 4 MG/2ML IJ SOLN: 4.0000 mg | Freq: Four times a day (QID) | INTRAMUSCULAR | Status: DC | PRN

## 2014-05-24 MED ORDER — OXYCODONE-ACETAMINOPHEN 5-325 MG PO TABS: 1.0000 | ORAL_TABLET | ORAL | Status: DC | PRN

## 2014-05-24 MED ORDER — LACTATED RINGERS IV SOLN: 500.0000 mL | INTRAVENOUS | Status: DC | PRN

## 2014-05-24 MED ORDER — ACETAMINOPHEN 325 MG PO TABS: 650.0000 mg | ORAL_TABLET | ORAL | Status: DC | PRN

## 2014-05-24 MED ORDER — LIDOCAINE HCL (PF) 1 % IJ SOLN
INTRAMUSCULAR | Status: DC | PRN
  Administered 2014-05-24 (×2): 5 mL
  Administered 2014-05-24: 3 mL

## 2014-05-24 MED ORDER — OXYTOCIN BOLUS FROM INFUSION
500.0000 mL | INTRAVENOUS | Status: DC
  Administered 2014-05-25: 500 mL via INTRAVENOUS

## 2014-05-24 MED ORDER — CITRIC ACID-SODIUM CITRATE 334-500 MG/5ML PO SOLN: 30.0000 mL | ORAL | Status: DC | PRN

## 2014-05-24 NOTE — Anesthesia Procedure Notes (Signed)
Epidural Patient location during procedure: OB  Staffing Anesthesiologist: Johnatan Baskette Performed by: anesthesiologist   Preanesthetic Checklist Completed: patient identified, site marked, surgical consent, pre-op evaluation, timeout performed, IV checked, risks and benefits discussed and monitors and equipment checked  Epidural Patient position: sitting Prep: ChloraPrep Patient monitoring: heart rate, continuous pulse ox and blood pressure Approach: midline Location: L3-L4 Injection technique: LOR saline  Needle:  Needle type: Tuohy  Needle gauge: 17 G Needle length: 9 cm and 9 Needle insertion depth: 5 cm Catheter type: closed end flexible Catheter size: 20 Guage Catheter at skin depth: 10 cm Test dose: negative  Assessment Events: blood not aspirated, injection not painful, no injection resistance, negative IV test and no paresthesia  Additional Notes   Patient tolerated the insertion well without complications.   

## 2014-05-24 NOTE — Progress Notes (Signed)
I assisted RN with some questions, by Viria Alvarez Spanish Interpreter °

## 2014-05-24 NOTE — H&P (Signed)
Nicole Page is a 39 y.o. female [phone] presenting for eval of ctx. Denies leaking or bldg. Reports +FM. [facility] and has been remarkable for 1) hx LGA (10lb) 2) AMA 3) ? Fetal aortic stenosis w/ nl fetal echo  3) language barrier  History OB History    Gravida Para Term Preterm AB TAB SAB Ectopic Multiple Living   7 3 3  0 0 0 0 0 0 3     History reviewed. No pertinent past medical history. Past Surgical History  Procedure Laterality Date  . No past surgeries     Family History: family history includes Hyperlipidemia in her father. Social History:  reports that she has never smoked. She does not have any smokeless tobacco history on file. She reports that she does not drink alcohol or use illicit drugs.   Prenatal Transfer Tool  Maternal Diabetes: No Genetic Screening: Declined- too late Maternal Ultrasounds/Referrals: Abnormal:  Findings:   Cardiac defect- ? Fetal aortic stenosis Fetal Ultrasounds or other Referrals:  Fetal echo- Normal Maternal Substance Abuse:  No Significant Maternal Medications:  None Significant Maternal Lab Results:  Lab values include: Group B Strep negative Other Comments:  None  ROS  Dilation: 5.5 Effacement (%): 70 Station: -2 Exam by:: K.Wilson,RN Blood pressure 116/48, pulse 83, temperature 98.9 F (37.2 C), temperature source Oral, resp. rate 18, height 5' 2.5" (1.588 m), weight 79.833 kg (176 lb), last menstrual period 05/28/2013. Exam Physical Exam  Constitutional: She is oriented to person, place, and time. She appears well-developed.  HENT:  Head: Normocephalic.  Cardiovascular: Normal rate.   Respiratory: Effort normal.  GI:  EFM 150s, +accels, no decels Ctx q 4-6 mins  Musculoskeletal: Normal range of motion.  Neurological: She is alert and oriented to person, place, and time.  Skin: Skin is warm and dry.  Psychiatric: She has a normal mood and affect. Her behavior is normal. Thought  content normal.    Prenatal labs: ABO, Rh: O/Positive/-- (10/15 0000) Antibody: Negative (10/15 0000) Rubella: Immune (10/15 0000) RPR: Nonreactive (10/15 0000)  HBsAg: Negative (10/15 0000)  HIV: Non-reactive (10/15 0000)  GBS: Negative (01/28 0000)   Assessment/Plan: IUP@39 .3wks Early active labor GBS neg  [facility] Prefers IV pain med Expectant management Anticipate SVD   Cam HaiSHAW, Monterio Bob CNM 05/24/2014, 7:44 PM

## 2014-05-24 NOTE — MAU Note (Signed)
Pain in abd and back . Started this morning.  Small amt of bloody mucous

## 2014-05-24 NOTE — Progress Notes (Signed)
I assisted Luci BankKim CNM with explanation of plan of care, and also I assisted Educational psychologistDiana RN  by Orlan LeavensViria Alvarez Spanish Interpreter

## 2014-05-24 NOTE — Anesthesia Preprocedure Evaluation (Signed)

## 2014-05-24 NOTE — Progress Notes (Signed)
I was present with  Dr Phillips GroutPeter Carignan  During the Epidural, by Orlan LeavensViria Alvarez Spanish Interpreter

## 2014-05-25 ENCOUNTER — Encounter (HOSPITAL_COMMUNITY): Payer: Self-pay | Admitting: *Deleted

## 2014-05-25 DIAGNOSIS — Z3A39 39 weeks gestation of pregnancy: Secondary | ICD-10-CM

## 2014-05-25 LAB — CBC
HEMATOCRIT: 36.9 % (ref 36.0–46.0)
HEMOGLOBIN: 12.5 g/dL (ref 12.0–15.0)
MCH: 28.1 pg (ref 26.0–34.0)
MCHC: 33.9 g/dL (ref 30.0–36.0)
MCV: 82.9 fL (ref 78.0–100.0)
Platelets: 164 10*3/uL (ref 150–400)
RBC: 4.45 MIL/uL (ref 3.87–5.11)
RDW: 15.7 % — ABNORMAL HIGH (ref 11.5–15.5)
WBC: 19.8 10*3/uL — ABNORMAL HIGH (ref 4.0–10.5)

## 2014-05-25 LAB — ABO/RH: ABO/RH(D): O POS

## 2014-05-25 MED ORDER — TETANUS-DIPHTH-ACELL PERTUSSIS 5-2.5-18.5 LF-MCG/0.5 IM SUSP
0.5000 mL | Freq: Once | INTRAMUSCULAR | Status: DC
Start: 2014-05-26 — End: 2014-05-26

## 2014-05-25 MED ORDER — PRENATAL MULTIVITAMIN CH
1.0000 | ORAL_TABLET | Freq: Every day | ORAL | Status: DC
  Administered 2014-05-25 – 2014-05-26 (×2): 1 via ORAL
  Filled 2014-05-25 (×2): qty 1

## 2014-05-25 MED ORDER — SENNOSIDES-DOCUSATE SODIUM 8.6-50 MG PO TABS
2.0000 | ORAL_TABLET | ORAL | Status: DC
  Administered 2014-05-26: 2 via ORAL
  Filled 2014-05-25: qty 2

## 2014-05-25 MED ORDER — ZOLPIDEM TARTRATE 5 MG PO TABS: 5.0000 mg | ORAL_TABLET | Freq: Every evening | ORAL | Status: DC | PRN

## 2014-05-25 MED ORDER — ONDANSETRON HCL 4 MG/2ML IJ SOLN: 4.0000 mg | INTRAMUSCULAR | Status: DC | PRN

## 2014-05-25 MED ORDER — IBUPROFEN 600 MG PO TABS
600.0000 mg | ORAL_TABLET | Freq: Four times a day (QID) | ORAL | Status: DC
  Administered 2014-05-25 – 2014-05-26 (×6): 600 mg via ORAL
  Filled 2014-05-25 (×6): qty 1

## 2014-05-25 MED ORDER — LANOLIN HYDROUS EX OINT: TOPICAL_OINTMENT | CUTANEOUS | Status: DC | PRN

## 2014-05-25 MED ORDER — DIBUCAINE 1 % RE OINT: 1.0000 "application " | TOPICAL_OINTMENT | RECTAL | Status: DC | PRN

## 2014-05-25 MED ORDER — WITCH HAZEL-GLYCERIN EX PADS: 1.0000 "application " | MEDICATED_PAD | CUTANEOUS | Status: DC | PRN

## 2014-05-25 MED ORDER — BENZOCAINE-MENTHOL 20-0.5 % EX AERO: 1.0000 "application " | INHALATION_SPRAY | CUTANEOUS | Status: DC | PRN

## 2014-05-25 MED ORDER — OXYCODONE-ACETAMINOPHEN 5-325 MG PO TABS
1.0000 | ORAL_TABLET | ORAL | Status: DC | PRN
  Administered 2014-05-25: 1 via ORAL
  Filled 2014-05-25: qty 1

## 2014-05-25 MED ORDER — DIPHENHYDRAMINE HCL 25 MG PO CAPS: 25.0000 mg | ORAL_CAPSULE | Freq: Four times a day (QID) | ORAL | Status: DC | PRN

## 2014-05-25 MED ORDER — OXYCODONE-ACETAMINOPHEN 5-325 MG PO TABS: 2.0000 | ORAL_TABLET | ORAL | Status: DC | PRN

## 2014-05-25 MED ORDER — ONDANSETRON HCL 4 MG PO TABS: 4.0000 mg | ORAL_TABLET | ORAL | Status: DC | PRN

## 2014-05-25 MED ORDER — SIMETHICONE 80 MG PO CHEW: 80.0000 mg | CHEWABLE_TABLET | ORAL | Status: DC | PRN

## 2014-05-25 NOTE — Anesthesia Postprocedure Evaluation (Signed)
Anesthesia Post Note  Patient: Nicole Page  Procedure(s) Performed: * No procedures listed *  Anesthesia type: Epidural  Patient location: Mother/Baby  Post pain: Pain level controlled  Post assessment: Post-op Vital signs reviewed  Last Vitals:  Filed Vitals:   05/25/14 0930  BP: 96/65  Pulse: 81  Temp: 36.8 C  Resp: 18    Post vital signs: Reviewed  Level of consciousness:alert  Complications: No apparent anesthesia complications

## 2014-05-25 NOTE — Lactation Note (Signed)
This note was copied from the chart of Nicole Page. Lactation Consultation Note  Patient Name: Nicole Page [id number]'WToday's Date: 05/25/2014 Reason for consult: Initial assessment   Of the mom and term baby, now 2914 hours old. Mom was feeding formula in addition to breast feeding. i explained supply and demand with mom, nd advised her to exclusively breast feed now, to bring in her milk.he will go back to work in 40 days, and plans to supplement with formula then. i [facility] for when she goes back to work, and see if it would be possible for her to pump when at work. On exam, i was able to hand express only small drops fo colostrum. Mom's brest very soft and fat . Spanish lactation information given to mom. Mom knows to call for questions/concerns.    Maternal Data Formula Feeding for Exclusion: Yes Reason for exclusion: Mother's choice to formula and breast feed on admission Has patient been taught Hand Expression?: Yes Does the patient have breastfeeding experience prior to this delivery?: Yes  Feeding    LATCH Score/Interventions                      Lactation Tools Discussed/Used     Consult Status Consult Status: Follow-up Date: 05/26/14 Follow-up type: In-patient    Nicole Page, Nicole Page 05/25/2014, 4:02 PM

## 2014-05-26 ENCOUNTER — Encounter (HOSPITAL_COMMUNITY): Payer: Self-pay | Admitting: Family Medicine

## 2014-05-26 ENCOUNTER — Ambulatory Visit: Payer: Self-pay

## 2014-05-26 LAB — RPR: RPR: NONREACTIVE

## 2014-05-26 MED ORDER — IBUPROFEN 600 MG PO TABS: 600.0000 mg | ORAL_TABLET | Freq: Four times a day (QID) | ORAL | Status: DC

## 2014-05-26 NOTE — Plan of Care (Signed)
Problem: Phase II Progression Outcomes Goal: Pain controlled on oral analgesia Outcome: Completed/Met Date Met:  05/26/14 Good pain control on po Motrin and Percocet. Goal: Progress activity as tolerated unless otherwise ordered Outcome: Completed/Met Date Met:  05/26/14 Patient tolerates up in room well. Goal: Tolerating diet Outcome: Completed/Met Date Met:  05/26/14 Tolerates Regular diet well. Goal: Other Phase II Outcomes/Goals Outcome: Completed/Met Date Met:  05/26/14 Passing flatus.  Problem: Discharge Progression Outcomes Goal: Barriers To Progression Addressed/Resolved Outcome: Completed/Met Date Met:  05/26/14 Voiding without any difficulty.

## 2014-05-26 NOTE — Progress Notes (Signed)
Checked on patient needs  °Spanish Interpreter °

## 2014-05-26 NOTE — Progress Notes (Signed)
Checked on patient.  °Spanish Interpreter  °

## 2014-05-26 NOTE — Progress Notes (Signed)
Discharge teaching complete. Interpretor used. Pt understood all instructions and did not have any questions. Baby is a baby patient.

## 2014-05-26 NOTE — Progress Notes (Signed)
Checked on patients needs. Also ordered patient dinner and breakfast. Spanish Interpreter

## 2014-05-26 NOTE — Discharge Instructions (Signed)

## 2014-05-26 NOTE — Lactation Note (Signed)
This note was copied from the chart of Nicole Page. Lactation Consultation Note  Patient Name: Nicole Page ZOXWR'UToday's Date: 05/26/2014   Baby patient, 45 hours of life. Assisted by "Gordy CouncilmanAlexandra," in-house Spanish interpreter. Called to give mom comfort gels for sore nipples. Mom states soreness only at beginning of BF, and she had same issue with first child. Given comfort gels with instruction. Mom had just finished nursing baby, so mom did not want assistance with latching. Mom states baby latching fine. Enc mom to call for assistance with latching as needed.   Maternal Data    Feeding Feeding Type: Breast Fed Length of feed: 20 min  LATCH Score/Interventions                      Lactation Tools Discussed/Used     Consult Status      Nicole Page, Nicole Page 05/26/2014, 11:23 PM

## 2014-05-26 NOTE — Discharge Summary (Signed)
Obstetric Discharge Summary Reason for Admission: onset of labor Prenatal Procedures: none Intrapartum Procedures: spontaneous vaginal delivery Postpartum Procedures: none Complications-Operative and Postpartum: none HEMOGLOBIN  Date Value Ref Range Status  05/25/2014 12.5 12.0 - 15.0 g/dL Final   HCT  Date Value Ref Range Status  05/25/2014 36.9 36.0 - 46.0 % Final   Delivery Note At 1:43 AM a viable and healthy female was delivered via  (Presentation: ROA;  ).  APGAR: 9, 9; weight 3515g.   Placenta status: Intact, Spontaneous.  Cord: 3 vessel with the following complications: none.   Anesthesia:  epidural Episiotomy:  none Lacerations:  none Suture Repair: n/a Est. Blood Loss (mL):  250  Healthy female infant delivered over intact perineum, vigorous cry, placed on mothers abdomen.  Mom to postpartum.  Baby to Couplet care / Skin to Skin.  Physical Exam:  General: alert, cooperative and no distress Lochia: appropriate Uterine Fundus: firm/NT DVT Evaluation: No evidence of DVT seen on physical exam. No cords or calf tenderness.  Discharge Diagnoses: Term Pregnancy-delivered  Discharge Information: Date: 05/26/2014 Activity: pelvic rest Diet: routine Medications: PNV and Ibuprofen Condition: stable Instructions: refer to practice specific booklet Discharge to: home Follow-up Information    Follow up with [facility] department.      Newborn Data: Live born female  Birth Weight: 7 lb 12 oz (3515 g) APGAR: 9, 9  Home with mother.  Nicole Page 05/26/2014, 6:56 AM

## 2014-05-26 NOTE — Progress Notes (Signed)
Checked on patient needs or questions.  Also ordered patient dinner and breakfast. Spanish Interpreter

## 2014-05-27 NOTE — Progress Notes (Signed)
Post discharge ur review completed. 

## 2015-01-28 IMAGING — US US OB COMP +14 WK
1 series · 14 of 28 positions shown · non-contrast
Comparison: none

CLINICAL DATA: Abdominal pain, 14 weeks 5 days pregnant.

EXAM:
LIMITED OBSTETRIC ULTRASOUND

[Series 1: us ob comp +14 wk · 0.20mm/px · 14 of 37 slices shown]
[im 2/37]
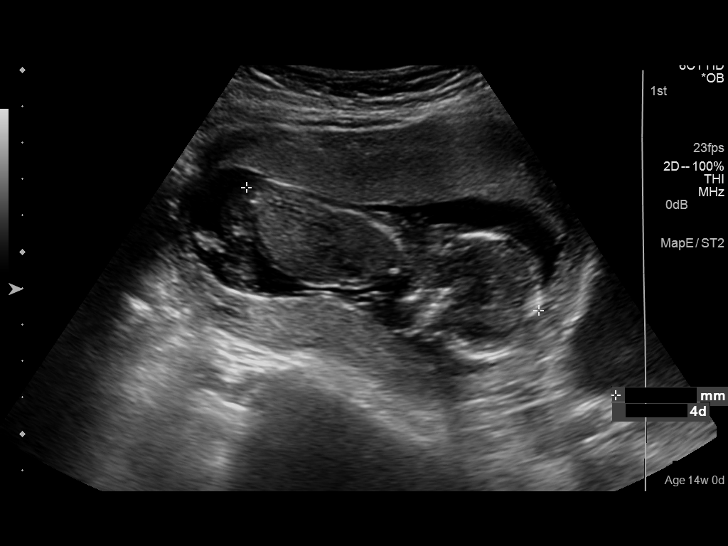
[im 5/37]
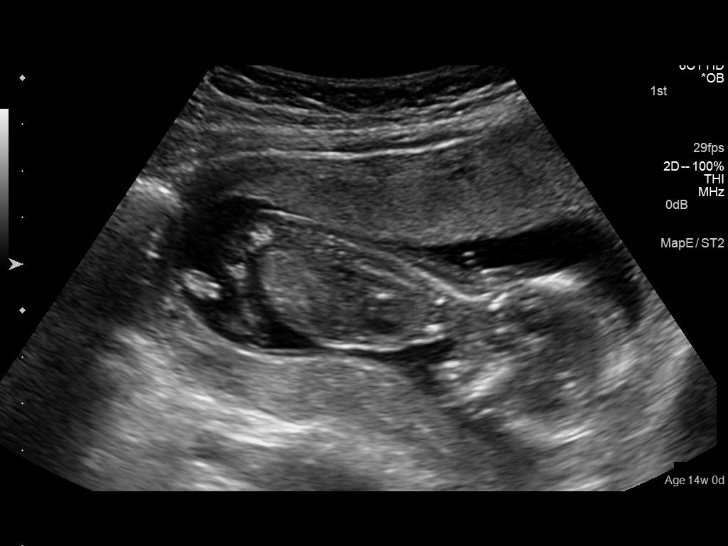
[im 7/37]
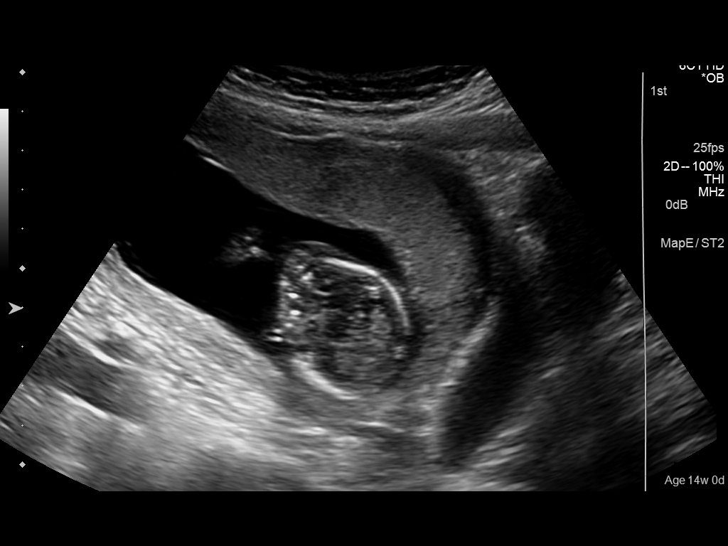
[im 10/37]
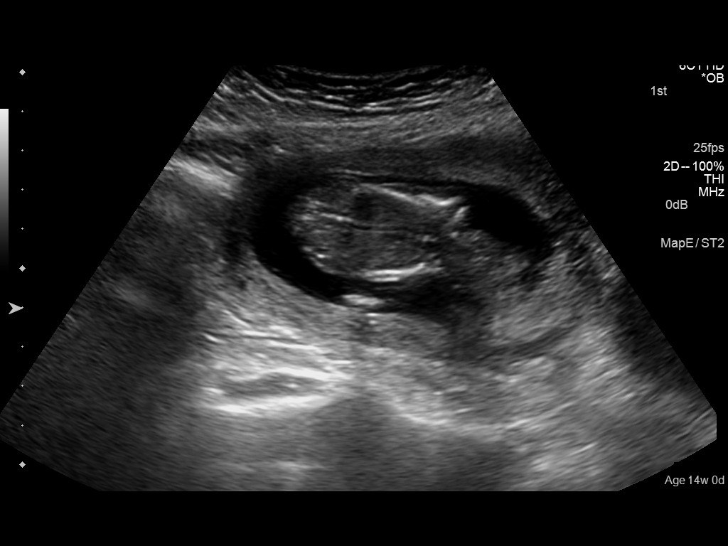
[im 13/37]
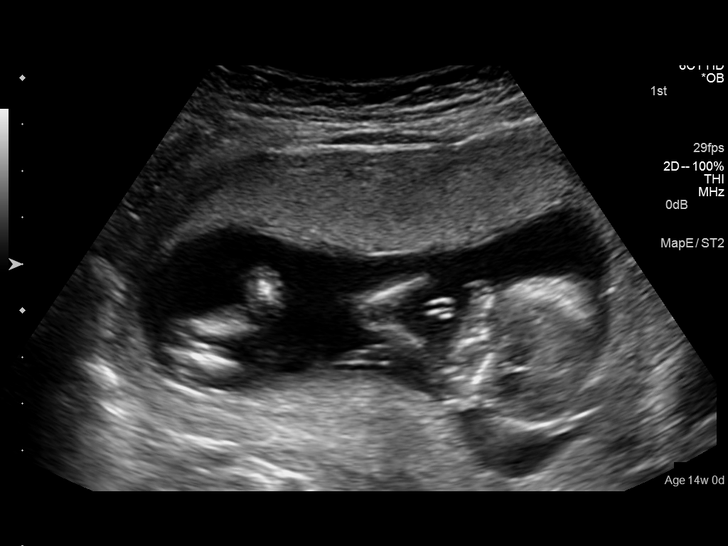
[im 15/37]
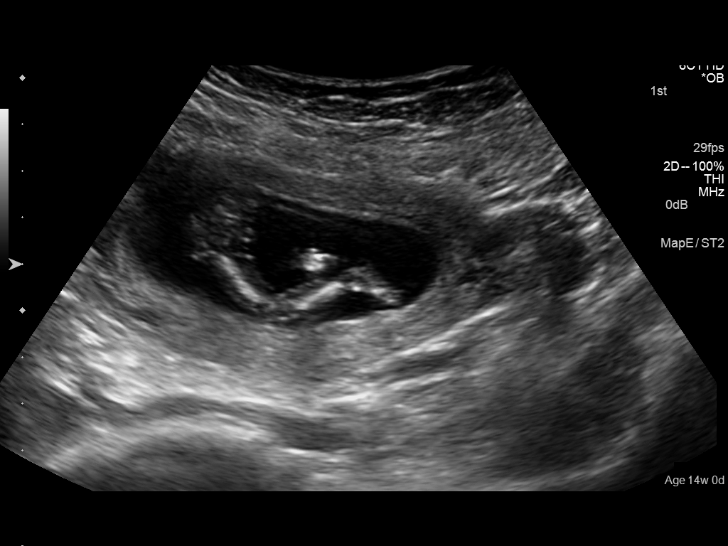
[im 18/37]
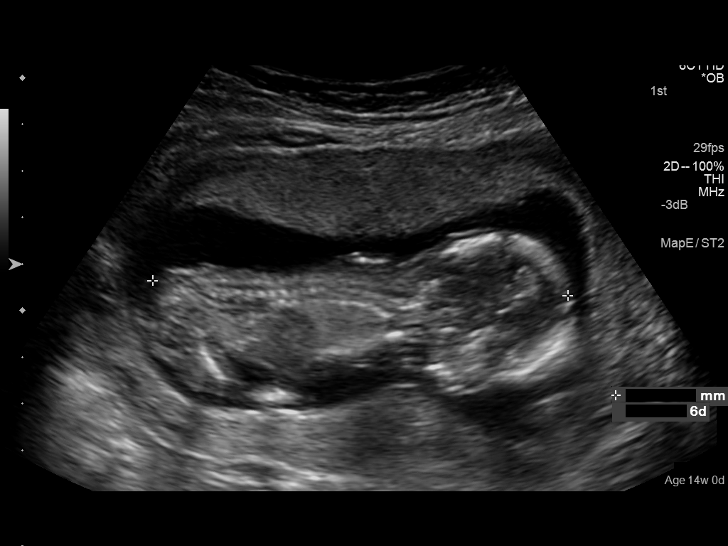
[im 21/37]
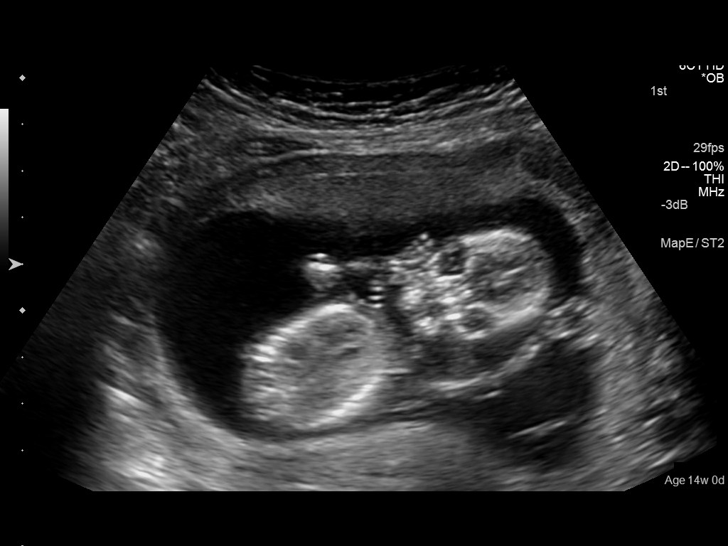
[im 23/37]
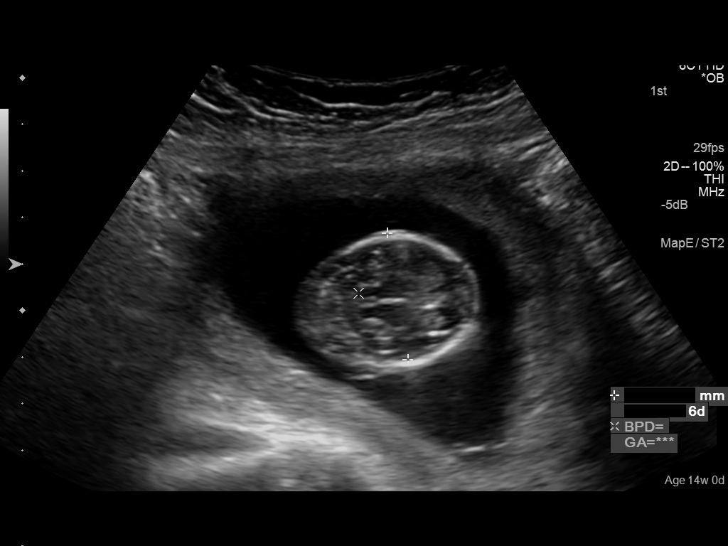
[im 26/37]
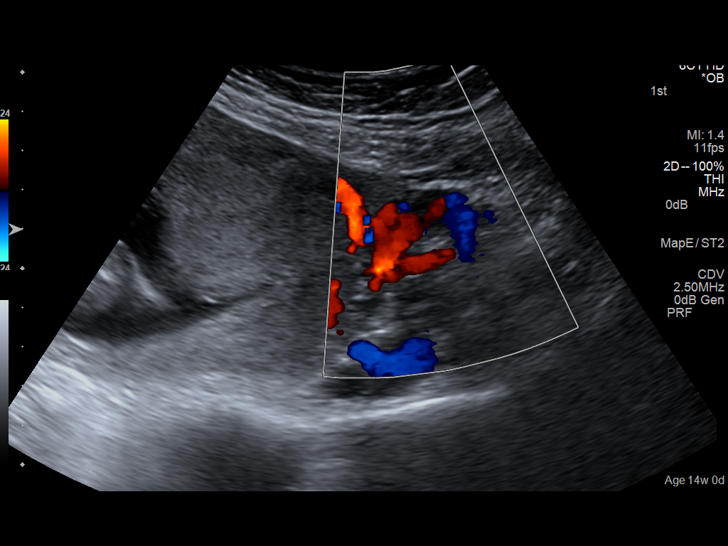
[im 29/37]
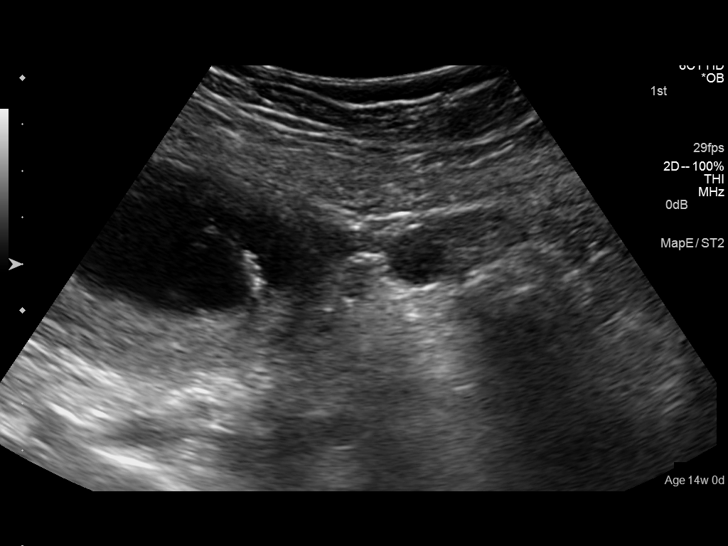
[im 31/37]
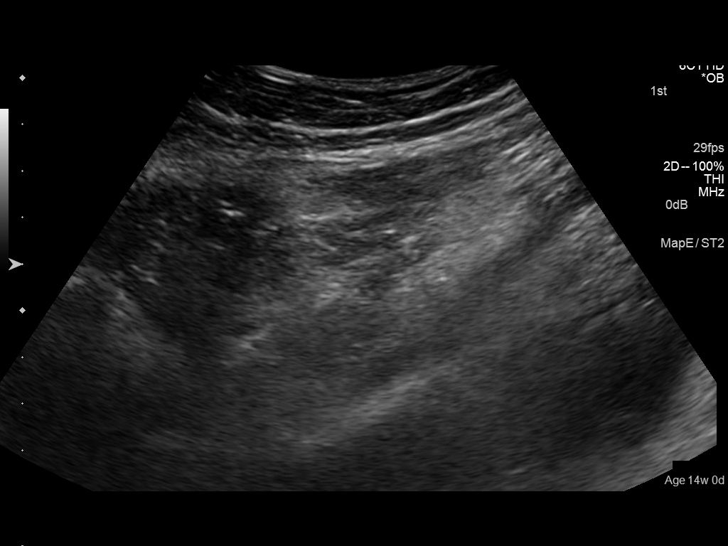
[im 34/37]
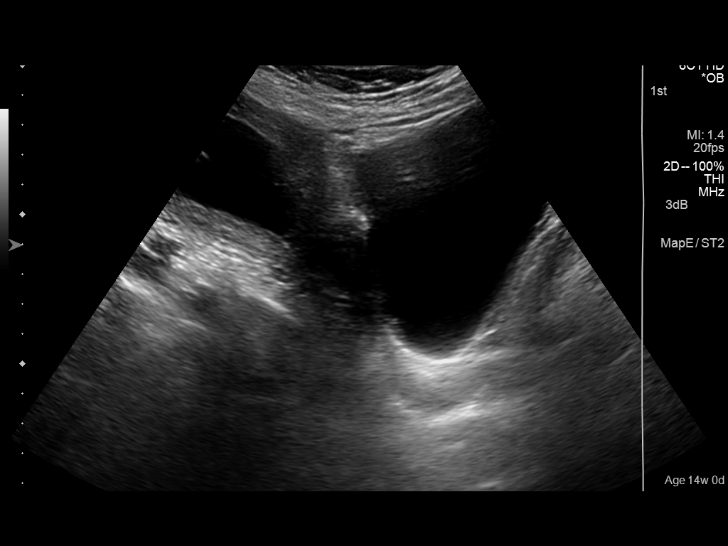
[im 37/37]
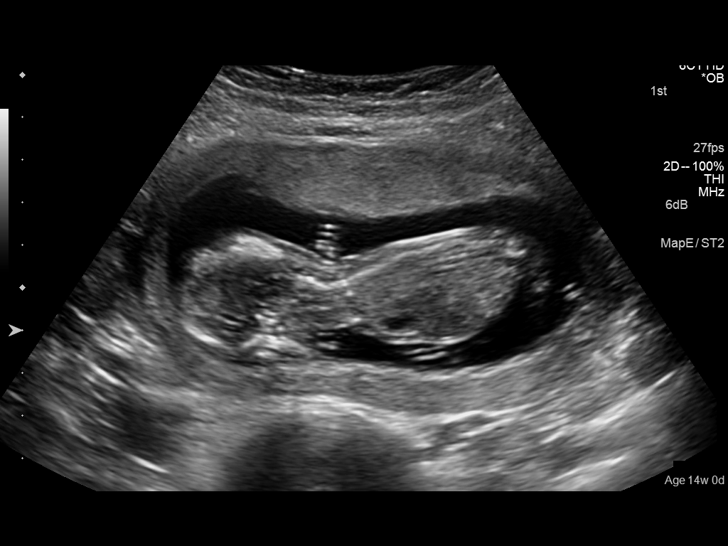

[14 of 28 positions shown; findings below may reference images not displayed]

FINDINGS: Number of Fetuses: 1

Heart Rate:  153 bpm

Movement: Yes

Presentation: Variable

Placental Location: Anterior

Previa: No

Amniotic Fluid (Subjective):  Within normal limits.

BPD:  27.6cm 14w  6d

MATERNAL FINDINGS:

Cervix:  Appears closed.

Uterus/Adnexae:  Ovaries not visualized.
IMPRESSION: Intrauterine pregnancy with estimated age of 14 weeks 6 days by BPD.

Ovaries not visualized.

This exam is performed on an emergent basis and does not
comprehensively evaluate fetal size, dating, or anatomy; follow-up
complete OB US should be considered if further fetal assessment is
warranted.

## 2015-03-19 IMAGING — US US OB DETAIL+14 WK
1 series · 12 of 28 positions shown · non-contrast
Comparison: none

[Series 1: us ob detail +14 wk · 12 of 107 slices shown]
[im 4/107]
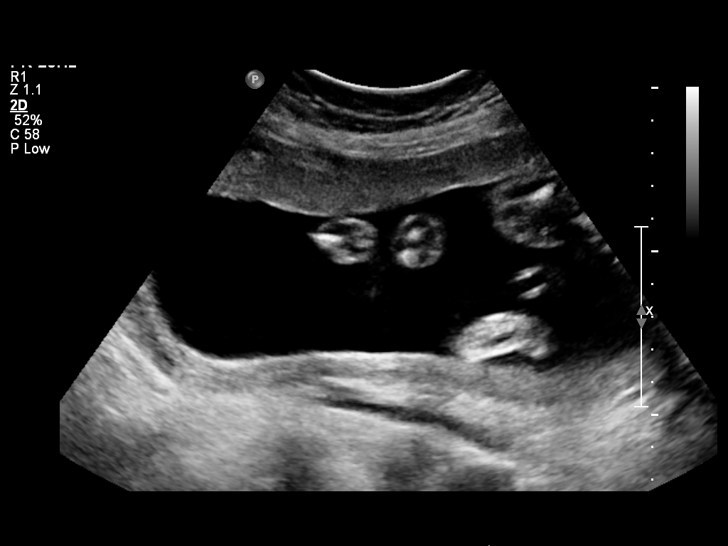
[im 12/107]
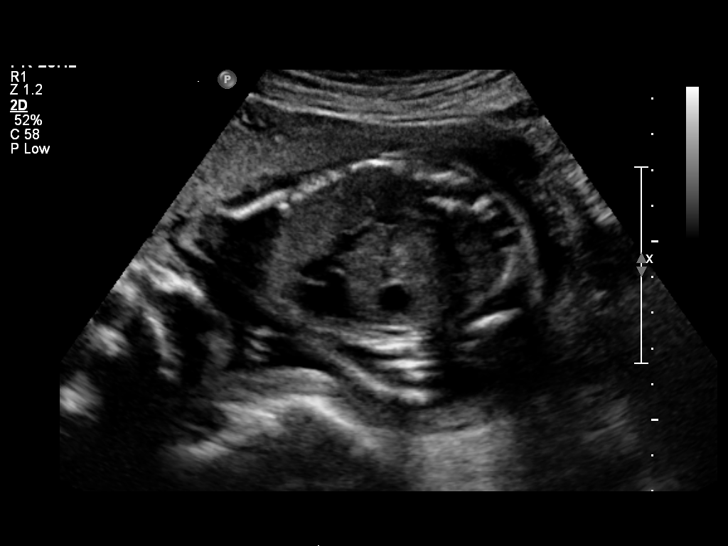
[im 20/107]
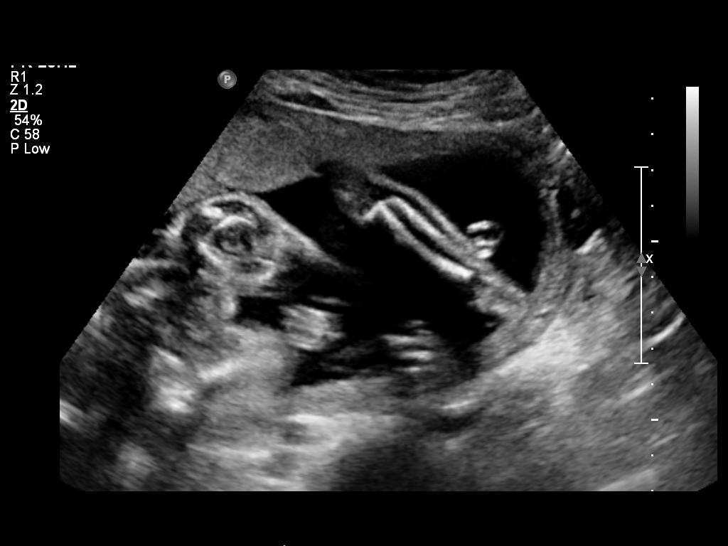
[im 32/107]
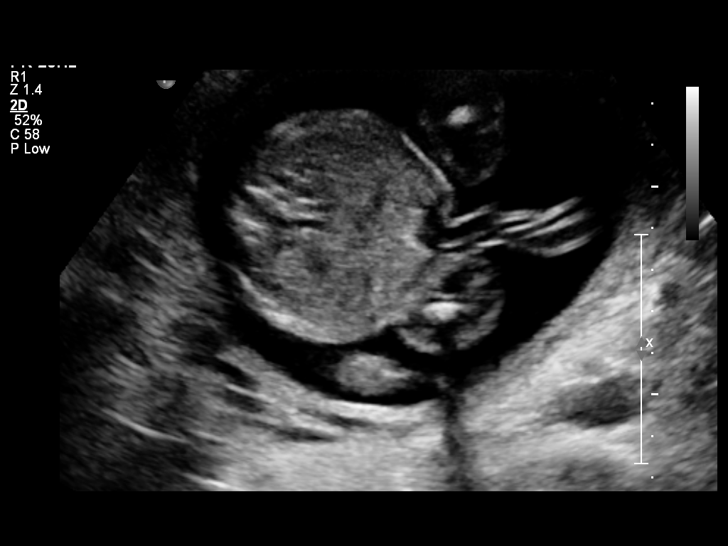
[im 40/107]
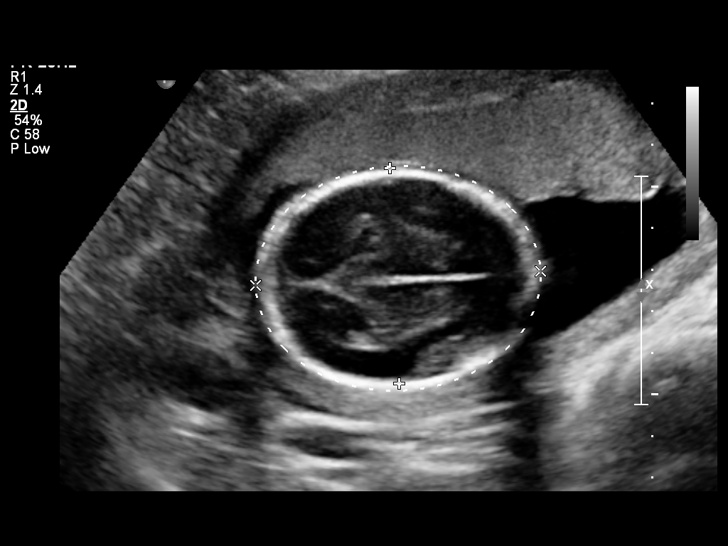
[im 48/107]
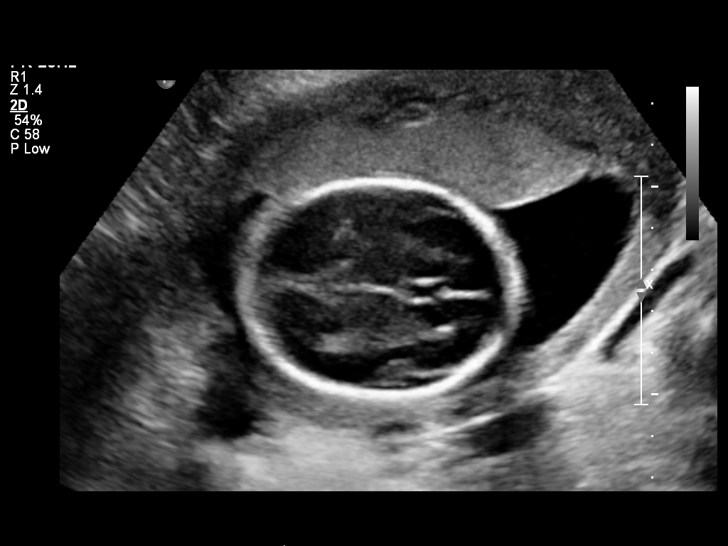
[im 59/107]
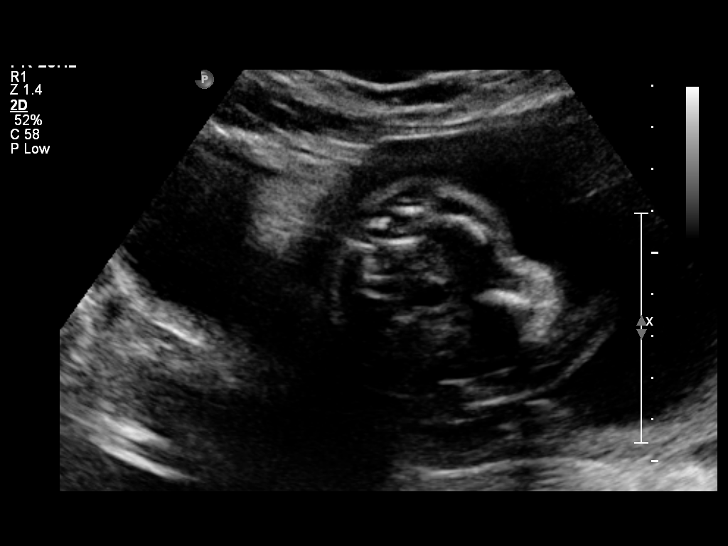
[im 67/107]
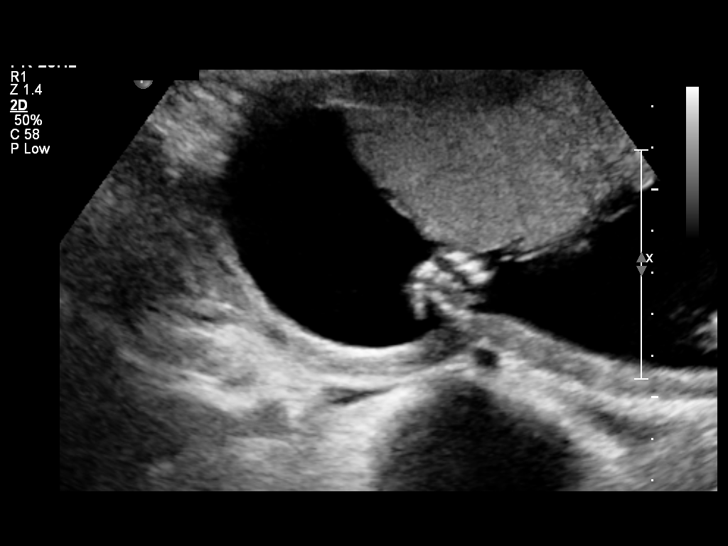
[im 75/107]
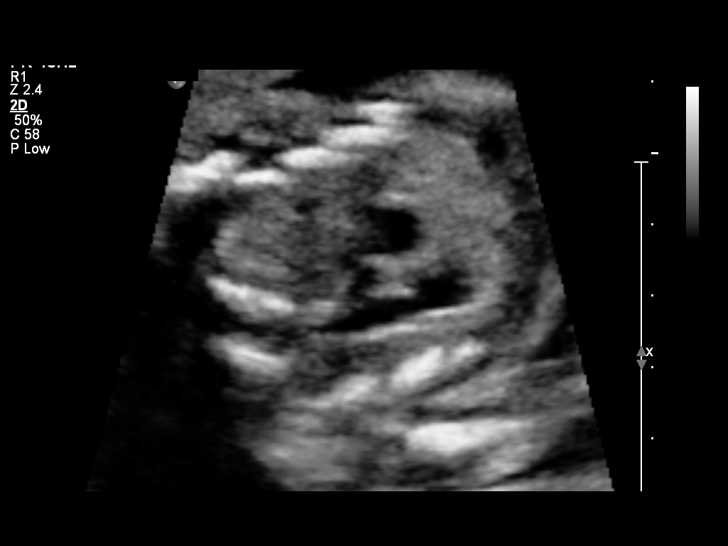
[im 87/107]
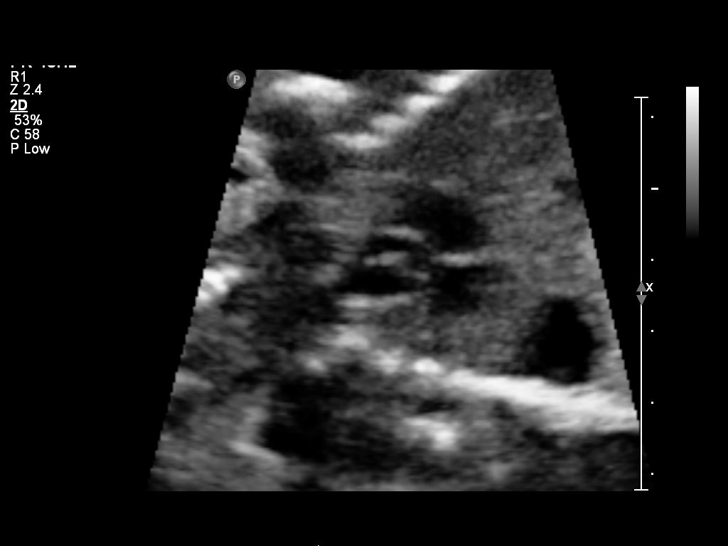
[im 95/107]
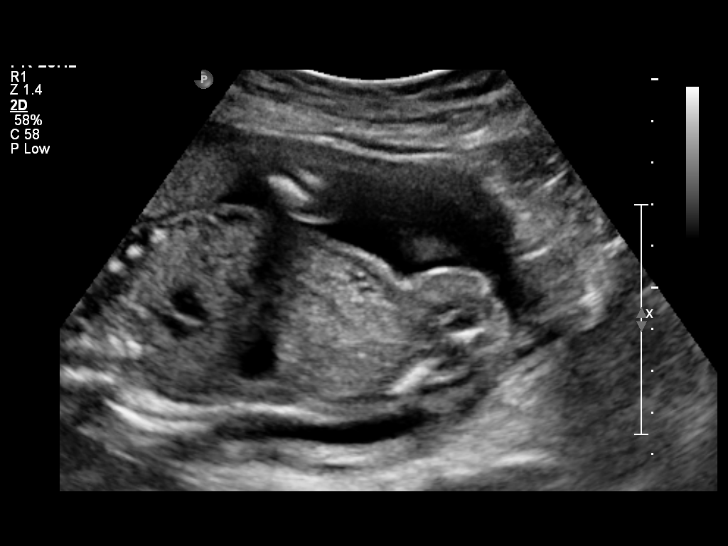
[im 103/107]
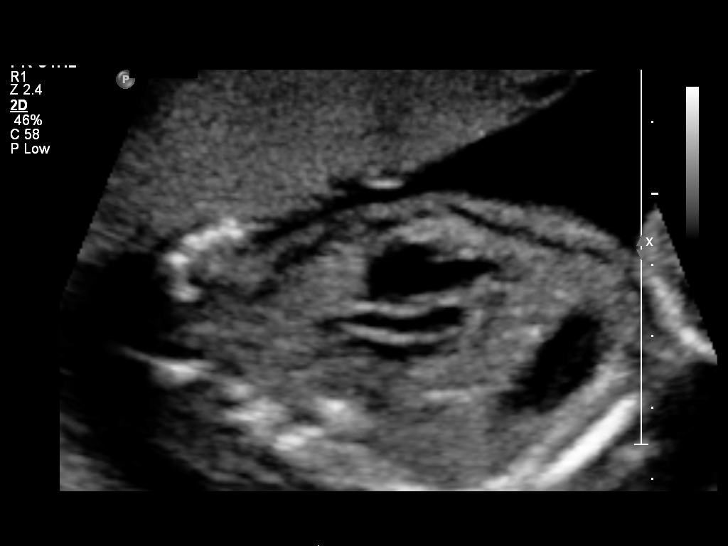

[12 of 28 positions shown; findings below may reference images not displayed]

OBSTETRICS REPORT
                      (Signed Final 01/22/2014 [DATE])

             TAY

Service(s) Provided

 US OB COMP + 14 WK                                    76805.1
Indications

 Advanced maternal age multigravida 35+, second
 trimester
 Detailed fetal anatomic survey                        Z36
 22 weeks gestation of pregnancy
Fetal Evaluation

 Num Of Fetuses:    1
 Fetal Heart Rate:  125                          bpm
 Cardiac Activity:  Observed
 Presentation:      Breech
 Placenta:          Anterior, above cervical os
 P. Cord            Visualized, central
 Insertion:

 Amniotic Fluid
 AFI FV:      Subjectively within normal limits
                                             Larg Pckt:     4.7  cm
Biometry

 BPD:     51.9  mm     G. Age:  21w 5d                CI:        73.07   70 - 86
                                                      FL/HC:      19.8   18.4 -

 HC:       193  mm     G. Age:  21w 4d       21  %    HC/AC:      1.10   1.06 -

 AC:     174.9  mm     G. Age:  22w 3d       56  %    FL/BPD:     73.6   71 - 87
 FL:      38.2  mm     G. Age:  22w 2d       48  %    FL/AC:      21.8   20 - 24
 HUM:     37.6  mm     G. Age:  23w 1d       79  %
 CER:     21.9  mm     G. Age:  20w 6d       19  %

 Est. FW:     486  gm      1 lb 1 oz     51  %
Gestational Age

 Clinical EDD:  22w 0d                                        EDD:   05/28/14
 U/S Today:     22w 0d                                        EDD:   05/28/14
 Best:          22w 0d     Det. By:  Clinical EDD             EDD:   05/28/14
Anatomy

 Cranium:          Appears normal         Aortic Arch:      Appears normal
 Fetal Cavum:      Appears normal         Ductal Arch:      Appears normal
 Ventricles:       Appears normal         Diaphragm:        Appears normal
 Choroid Plexus:   Appears normal         Stomach:          Appears normal, left
                                                            sided
 Cerebellum:       Appears normal         Abdomen:          Appears normal
 Posterior Fossa:  Appears normal         Abdominal Wall:   Appears nml (cord
                                                            insert, abd wall)
 Nuchal Fold:      Appears normal         Cord Vessels:     Appears normal (3
                                                            vessel cord)
 Face:             Orbits appear          Kidneys:          Appear normal
                   normal
 Lips:             Appears normal         Bladder:          Appears normal
 Heart:            Appears normal         Spine:            Appears normal
                   (4CH, axis, and
                   situs)
 RVOT:             Appears normal         Lower             Appears normal
                                          Extremities:
 LVOT:             Appears normal         Upper             Appears normal
                                          Extremities:

 Other:  Heels visualized. Female gender. Technically difficult due to fetal
         position.
Targeted Anatomy

 Fetal Central Nervous System
 Lat. Ventricles:  7.1                    Cisterna Magna:
Cervix Uterus Adnexa

 Cervical Length:    4.6      cm

 Cervix:       Normal appearance by transabdominal scan.

 Left Ovary:    Within normal limits.
 Right Ovary:   Not visualized.
Comments

 The patient's fetal anatomic survey is now complete.  The
 LVOT appears subjectively narrow compared to the RVOT,
 but this could not be confirmed on today's ultrasound.  No
 other fetal anomalies or soft markers of aneuploidy were
 seen.  I recommend Ms. Avelina Zeman have a fetal
 echo to more clearly define fetal cardiac anatomy.
Impression

 Single living intrauterine pregnancy at  22 weeks 0 days.
 Appropriate fetal growth (51%).
 Normal amniotic fluid volume.
 Possible aortic stenosis.
 Otherwise normal fetal anatomy.
 No other fetal anomalies or soft markers of aneuploidy seen.
Recommendations

 Recommend fetal echo, if normal no further ultrasound are
 required unless additional problems arise.
                [place], [place]

## 2020-10-07 ENCOUNTER — Encounter (HOSPITAL_COMMUNITY): Payer: Self-pay

## 2020-10-07 ENCOUNTER — Ambulatory Visit (HOSPITAL_COMMUNITY)
Admission: EM | Admit: 2020-10-07 | Discharge: 2020-10-07 | Disposition: A | Discharge status: Home / Self Care | Payer: Self-pay | Attending: Urgent Care | Admitting: Urgent Care

## 2020-10-07 ENCOUNTER — Other Ambulatory Visit: Payer: Self-pay

## 2020-10-07 DIAGNOSIS — R300 Dysuria: Secondary | ICD-10-CM

## 2020-10-07 DIAGNOSIS — N3091 Cystitis, unspecified with hematuria: Secondary | ICD-10-CM

## 2020-10-07 LAB — POCT URINALYSIS DIPSTICK, ED / UC
Bilirubin Urine: NEGATIVE
Glucose, UA: NEGATIVE mg/dL
Ketones, ur: NEGATIVE mg/dL
Nitrite: NEGATIVE
Protein, ur: 30 mg/dL — AB
Specific Gravity, Urine: <=1.005 (ref 1.005–1.030)
Urobilinogen, UA: 0.2 mg/dL (ref 0.0–1.0)
pH: 6.5 (ref 5.0–8.0)

## 2020-10-07 LAB — POC URINE PREG, ED: Preg Test, Ur: NEGATIVE

## 2020-10-07 MED ORDER — CEPHALEXIN 500 MG PO CAPS: 500.0000 mg | ORAL_CAPSULE | Freq: Two times a day (BID) | ORAL | 0 refills | Status: DC

## 2020-10-07 NOTE — ED Provider Notes (Signed)
Nicole Page [facility]   MRN: [id number] DOB: September 04, 1975  Subjective:   Nicole Page is a 45 y.o. female presenting for 2-day history of acute onset dysuria, urinary frequency, urinary urgency, hematuria, lower abdominal pain.  Denies fever, nausea, vomiting, flank tenderness.  Patient tries to hydrate very well.  No concern for STI.  No current facility-administered medications for this encounter.  Current Outpatient Medications:    acetaminophen (TYLENOL) 500 MG tablet, Take 1 tablet (500 mg total) by mouth every 6 (six) hours as needed. (Patient taking differently: Take 500 mg by mouth every 6 (six) hours as needed for headache.), Disp: 30 tablet, Rfl: 0   Chlorpheniramine Maleate (ALLERGY PO), Take 1 tablet by mouth once., Disp: , Rfl:    ibuprofen (ADVIL,MOTRIN) 600 MG tablet, Take 1 tablet (600 mg total) by mouth every 6 (six) hours., Disp: 30 tablet, Rfl: 0   Prenatal Vit-Fe Fumarate-FA (PRENATAL MULTIVITAMIN) TABS tablet, Take 1 tablet by mouth daily at 12 noon., Disp: , Rfl:    No Known Allergies  History reviewed. No pertinent past medical history.   Past Surgical History:  Procedure Laterality Date   NO PAST SURGERIES      Family History  Problem Relation Age of Onset   Hyperlipidemia Father     Social History   Tobacco Use   Smoking status: Never  Substance Use Topics   Alcohol use: No   Drug use: No    ROS   Objective:   Vitals: BP 119/82   Pulse 76   Temp 98.2 F (36.8 C) (Oral)   Resp 18   LMP 09/22/2020   SpO2 98%   Physical Exam Constitutional:      General: She is not in acute distress.    Appearance: Normal appearance. She is well-developed and normal weight. She is not ill-appearing, toxic-appearing or diaphoretic.  HENT:     Head: Normocephalic and atraumatic.     Right Ear: External ear normal.     Left Ear: External ear normal.     Nose: Nose normal.     Mouth/Throat:     Mouth: Mucous membranes are moist.      Pharynx: Oropharynx is clear.  Eyes:     General: No scleral icterus.    Extraocular Movements: Extraocular movements intact.     Pupils: Pupils are equal, round, and reactive to light.  Cardiovascular:     Rate and Rhythm: Normal rate and regular rhythm.     Heart sounds: Normal heart sounds. No murmur heard.   No friction rub. No gallop.  Pulmonary:     Effort: Pulmonary effort is normal. No respiratory distress.     Breath sounds: Normal breath sounds. No stridor. No wheezing, rhonchi or rales.  Abdominal:     General: Bowel sounds are normal. There is no distension.     Palpations: Abdomen is soft. There is no mass.     Tenderness: There is no abdominal tenderness. There is no right CVA tenderness, left CVA tenderness, guarding or rebound.  Skin:    General: Skin is warm and dry.     Coloration: Skin is not pale.     Findings: No rash.  Neurological:     General: No focal deficit present.     Mental Status: She is alert and oriented to person, place, and time.  Psychiatric:        Mood and Affect: Mood normal.        Behavior: Behavior normal.  Thought Content: Thought content normal.        Judgment: Judgment normal.    Results for orders placed or performed during the hospital encounter of 10/07/20 (from the past 24 hour(s))  POC Urinalysis dipstick     Status: Abnormal   Collection Time: 10/07/20  7:02 PM  Result Value Ref Range   Glucose, UA NEGATIVE NEGATIVE mg/dL   Bilirubin Urine NEGATIVE NEGATIVE   Ketones, ur NEGATIVE NEGATIVE mg/dL   Specific Gravity, Urine <=1.005 1.005 - 1.030   Hgb urine dipstick LARGE (A) NEGATIVE   pH 6.5 5.0 - 8.0   Protein, ur 30 (A) NEGATIVE mg/dL   Urobilinogen, UA 0.2 0.0 - 1.0 mg/dL   Nitrite NEGATIVE NEGATIVE   Leukocytes,Ua MODERATE (A) NEGATIVE  POC urine pregnancy     Status: None   Collection Time: 10/07/20  7:04 PM  Result Value Ref Range   Preg Test, Ur NEGATIVE NEGATIVE    Assessment and Plan :   PDMP not  reviewed this encounter.  1. Hemorrhagic cystitis   2. Dysuria     Start Keflex to cover for acute cystitis, urine culture pending.  Recommended aggressive hydration, limiting urinary irritants. Counseled patient on potential for adverse effects with medications prescribed/recommended today, ER and return-to-clinic precautions discussed, patient verbalized understanding.    Wallis Bamberg, [place] 10/07/20 1919

## 2020-10-07 NOTE — ED Triage Notes (Signed)
Pt reports severe pain in lower abdomen when urinating. Pt also reports back pain and pain in vaginal area when not urinating and reports seeing blood when wiping.

## 2020-11-25 ENCOUNTER — Other Ambulatory Visit: Payer: Self-pay

## 2020-11-25 ENCOUNTER — Ambulatory Visit (HOSPITAL_COMMUNITY)
Admission: EM | Admit: 2020-11-25 | Discharge: 2020-11-25 | Disposition: A | Discharge status: Home / Self Care | Payer: Self-pay | Attending: Student | Admitting: Student

## 2020-11-25 ENCOUNTER — Encounter (HOSPITAL_COMMUNITY): Payer: Self-pay | Admitting: Emergency Medicine

## 2020-11-25 DIAGNOSIS — R300 Dysuria: Secondary | ICD-10-CM | POA: Insufficient documentation

## 2020-11-25 DIAGNOSIS — N3001 Acute cystitis with hematuria: Secondary | ICD-10-CM | POA: Insufficient documentation

## 2020-11-25 LAB — POCT URINALYSIS DIPSTICK, ED / UC
Bilirubin Urine: NEGATIVE
Bilirubin Urine: NEGATIVE
Glucose, UA: NEGATIVE mg/dL
Glucose, UA: NEGATIVE mg/dL
Hgb urine dipstick: NEGATIVE
Ketones, ur: NEGATIVE mg/dL
Ketones, ur: NEGATIVE mg/dL
Nitrite: NEGATIVE
Nitrite: NEGATIVE
Protein, ur: NEGATIVE mg/dL
Protein, ur: NEGATIVE mg/dL
Specific Gravity, Urine: 1.01 (ref 1.005–1.030)
Specific Gravity, Urine: <=1.005 (ref 1.005–1.030)
Urobilinogen, UA: 0.2 mg/dL (ref 0.0–1.0)
Urobilinogen, UA: 0.2 mg/dL (ref 0.0–1.0)
pH: 5 (ref 5.0–8.0)
pH: 6.5 (ref 5.0–8.0)

## 2020-11-25 LAB — POC URINE PREG, ED: Preg Test, Ur: NEGATIVE

## 2020-11-25 MED ORDER — CIPROFLOXACIN HCL 500 MG PO TABS: 500.0000 mg | ORAL_TABLET | Freq: Two times a day (BID) | ORAL | 0 refills | Status: DC

## 2020-11-25 NOTE — ED Provider Notes (Signed)
[facility]   MRN: [id number] DOB: [birth year]  Subjective:   Nicole Page is a 45 y.o. female presenting for 1 day history of acute onset recurrent dysuria, urinary frequency, urinary urgency, lower abdominal pain.  No fever, nausea, vomiting, flank pain, vaginal discharge.  In early July, patient had a urinary tract infection but unfortunately a urine culture was not completed.  She took Keflex at the time with complete resolution of her symptoms up until this episode.  She would like to get checked for STIs just to make sure.  She is sexually active with her husband only, does not use condoms for protection.  She hydrates very well, limits her urinary irritants.  No current facility-administered medications for this encounter.  Current Outpatient Medications:    acetaminophen (TYLENOL) 500 MG tablet, Take 1 tablet (500 mg total) by mouth every 6 (six) hours as needed. (Patient taking differently: Take 500 mg by mouth every 6 (six) hours as needed for headache.), Disp: 30 tablet, Rfl: 0   cephALEXin (KEFLEX) 500 MG capsule, Take 1 capsule (500 mg total) by mouth 2 (two) times daily. (Patient not taking: Reported on 11/25/2020), Disp: 10 capsule, Rfl: 0   Chlorpheniramine Maleate (ALLERGY PO), Take 1 tablet by mouth once. (Patient not taking: Reported on 11/25/2020), Disp: , Rfl:    ibuprofen (ADVIL,MOTRIN) 600 MG tablet, Take 1 tablet (600 mg total) by mouth every 6 (six) hours., Disp: 30 tablet, Rfl: 0   Prenatal Vit-Fe Fumarate-FA (PRENATAL MULTIVITAMIN) TABS tablet, Take 1 tablet by mouth daily at 12 noon. (Patient not taking: Reported on 11/25/2020), Disp: , Rfl:    No Known Allergies  History reviewed. No pertinent past medical history.   Past Surgical History:  Procedure Laterality Date   NO PAST SURGERIES      Family History  Problem Relation Age of Onset   CAD Mother    Hyperlipidemia Father     Social History   Tobacco Use   Smoking status:  Never   Smokeless tobacco: Never  Vaping Use   Vaping Use: Never used  Substance Use Topics   Alcohol use: No   Drug use: No    ROS   Objective:   Vitals: BP 114/80 (BP Location: Right Arm)   Pulse 70   Temp 98.5 F (36.9 C) (Oral)   Resp 18   LMP 10/22/2020   SpO2 98%   Physical Exam Constitutional:      General: She is not in acute distress.    Appearance: Normal appearance. She is well-developed. She is not ill-appearing, toxic-appearing or diaphoretic.  HENT:     Head: Normocephalic and atraumatic.     Nose: Nose normal.     Mouth/Throat:     Mouth: Mucous membranes are moist.     Pharynx: Oropharynx is clear.  Eyes:     General: No scleral icterus.       Right eye: No discharge.        Left eye: No discharge.     Extraocular Movements: Extraocular movements intact.     Conjunctiva/sclera: Conjunctivae normal.     Pupils: Pupils are equal, round, and reactive to light.  Cardiovascular:     Rate and Rhythm: Normal rate.  Pulmonary:     Effort: Pulmonary effort is normal.  Abdominal:     General: Bowel sounds are normal. There is no distension.     Palpations: Abdomen is soft. There is no mass.     Tenderness:  There is no abdominal tenderness. There is no right CVA tenderness, left CVA tenderness, guarding or rebound.  Skin:    General: Skin is warm and dry.  Neurological:     General: No focal deficit present.     Mental Status: She is alert and oriented to person, place, and time.  Psychiatric:        Mood and Affect: Mood normal.        Behavior: Behavior normal.        Thought Content: Thought content normal.        Judgment: Judgment normal.    Results for orders placed or performed during the hospital encounter of 11/25/20 (from the past 24 hour(s))  POC Urinalysis dipstick     Status: Abnormal   Collection Time: 11/25/20  9:56 AM  Result Value Ref Range   Glucose, UA NEGATIVE NEGATIVE mg/dL   Bilirubin Urine NEGATIVE NEGATIVE   Ketones, ur  NEGATIVE NEGATIVE mg/dL   Specific Gravity, Urine <=1.005 1.005 - 1.030   Hgb urine dipstick MODERATE (A) NEGATIVE   pH 6.5 5.0 - 8.0   Protein, ur NEGATIVE NEGATIVE mg/dL   Urobilinogen, UA 0.2 0.0 - 1.0 mg/dL   Nitrite NEGATIVE NEGATIVE   Leukocytes,Ua SMALL (A) NEGATIVE  POC urine pregnancy     Status: None   Collection Time: 11/25/20  9:59 AM  Result Value Ref Range   Preg Test, Ur NEGATIVE NEGATIVE    Assessment and Plan :   PDMP not reviewed this encounter.  1. Acute cystitis with hematuria   2. Dysuria     We will treat with ciprofloxacin, urine culture pending.  STI testing pending.  Recommended follow-up with urology should she continue to have symptoms. Counseled patient on potential for adverse effects with medications prescribed/recommended today, ER and return-to-clinic precautions discussed, patient verbalized understanding.    Wallis Bamberg, PA-C 11/25/20 1046

## 2020-11-25 NOTE — ED Triage Notes (Signed)
symptoms started yesterday.  Reports pain and burning with urination.  Low abdominal pain, no back pain.  History of uti

## 2020-11-26 LAB — CERVICOVAGINAL ANCILLARY ONLY
Bacterial Vaginitis (gardnerella): NEGATIVE
Candida Glabrata: NEGATIVE
Candida Vaginitis: NEGATIVE
Chlamydia: NEGATIVE
Comment: NEGATIVE
Comment: NEGATIVE
Comment: NEGATIVE
Comment: NEGATIVE
Comment: NEGATIVE
Comment: NORMAL
Neisseria Gonorrhea: NEGATIVE
Trichomonas: NEGATIVE

## 2020-11-26 LAB — URINE CULTURE

## 2021-05-12 NOTE — Congregational Nurse Program (Signed)
°  Dept: [phone]   Congregational Nurse Program Note  Date of Encounter: 05/12/2021  Past Medical History: No past medical history on file.  Encounter Details:  CNP Questionnaire - 05/12/21 1612       Questionnaire   Do you give verbal consent to treat you today? Yes    Location Patient Served  Scientist, research (life sciences) or Organization    Patient Status Unknown    Insurance Uninsured ([employer] Card/Care Connects/Self-Pay)    Insurance Referral [employer] Research officer, trade union    Medication N/A    Medical Provider No    Screening Referrals Annual Wellness Visit    Medical Referral [facility]    Medical Appointment Made Other    Food N/A    Transportation N/A    Housing/Utilities N/A    Interpersonal Safety N/A    Intervention Navigate Healthcare System    ED Visit Averted N/A    Life-Saving Intervention Made N/A            Patient seen today to help find her a PCP to establish care. Patient was referred to [facility] and to [place] coordinator to find a PCP. Patient was also given a orange card updated spanish application and referred to prevent blindness program. Application for vision and to [place] coordinator sent.

## 2021-05-26 ENCOUNTER — Encounter: Payer: Self-pay | Admitting: Nurse Practitioner

## 2021-05-26 ENCOUNTER — Ambulatory Visit (INDEPENDENT_AMBULATORY_CARE_PROVIDER_SITE_OTHER): Payer: Self-pay | Admitting: Nurse Practitioner

## 2021-05-26 ENCOUNTER — Other Ambulatory Visit: Payer: Self-pay

## 2021-05-26 VITALS — BP 130/72 | HR 60 | Temp 98.0°F | Ht 63.0 in | Wt 178.4 lb

## 2021-05-26 DIAGNOSIS — R52 Pain, unspecified: Secondary | ICD-10-CM

## 2021-05-26 DIAGNOSIS — Z0000 Encounter for general adult medical examination without abnormal findings: Secondary | ICD-10-CM

## 2021-05-26 LAB — POCT URINALYSIS DIP (CLINITEK)
Bilirubin, UA: NEGATIVE
Glucose, UA: NEGATIVE mg/dL
Ketones, POC UA: NEGATIVE mg/dL
Leukocytes, UA: NEGATIVE
Nitrite, UA: NEGATIVE
POC PROTEIN,UA: NEGATIVE
Spec Grav, UA: 1.025 (ref 1.010–1.025)
Urobilinogen, UA: 0.2 E.U./dL
pH, UA: 5.5 (ref 5.0–8.0)

## 2021-05-26 LAB — POCT GLYCOSYLATED HEMOGLOBIN (HGB A1C)
HbA1c POC (<> result, manual entry): 6 % (ref 4.0–5.6)
HbA1c, POC (controlled diabetic range): 6 % (ref 0.0–7.0)
HbA1c, POC (prediabetic range): 6 % (ref 5.7–6.4)
Hemoglobin A1C: 6 % — AB (ref 4.0–5.6)

## 2021-05-26 NOTE — Progress Notes (Signed)
Nicole Page, [name]  [address] Phone:  [phone]   Fax:  [phone] Subjective:   Patient ID: Nicole Page, female    DOB: 1975-08-26, 46 y.o.   MRN: [id number]  Chief Complaint  Patient presents with   Establish Care    Pt is here to establish care. Pt stated she had a physical a year ago and was told that her white blood cells vitamin D is low    HPI Nicole Page 46 y.o. female with no significant medical history to the [facility] to establish care. Last visit with PCP 1 yr ago.  Patient states that she told 1 yr ago that she had low wbc count and low vitamin D, would like this checked today. States that she is also concerned about generalized body aches "in bones" x 6 mths. Currently rates pain 6/10 and describes as aching, worsens at night. Has been taking tylenol with only mild to moderate improvement in pain. Has not been evaluated for symptoms in the past.   Denies any other concerns today. LMP 04/20/2021. Questioned changes in menstrual cycle, states that she sometimes goes 3 mths without menstrual cycle. Suspects that may be starting menopause. Mother experienced menopause in 49's. Endorses generally eating healthy, but does not exercise regularly. Currently unemployed, stays at home with four children and husband.  Denies any other complaints. Denies any fever. Denies any fatigue, chest pain, shortness of breath, HA or dizziness. Denies any blurred vision, numbness or tingling.   History reviewed. No pertinent past medical history.  Past Surgical History:  Procedure Laterality Date   NO PAST SURGERIES      Family History  Problem Relation Age of Onset   CAD Mother    Hyperlipidemia Father     Social History   Socioeconomic History   Marital status: Single    Spouse name: Not on file   Number of children: Not on file   Years of education: Not on file   Highest education level: Not on file   Occupational History   Not on file  Tobacco Use   Smoking status: Never   Smokeless tobacco: Never  Vaping Use   Vaping Use: Never used  Substance and Sexual Activity   Alcohol use: No   Drug use: No   Sexual activity: Yes    Birth control/protection: Condom  Other Topics Concern   Not on file  Social History Narrative   Not on file   Social Determinants of Health   Financial Resource Strain: Not on file  Food Insecurity: Not on file  Transportation Needs: Not on file  Physical Activity: Not on file  Stress: Not on file  Social Connections: Not on file  Intimate Partner Violence: Not on file    Outpatient Medications Prior to Visit  Medication Sig Dispense Refill   acetaminophen (TYLENOL) 500 MG tablet Take 1 tablet (500 mg total) by mouth every 6 (six) hours as needed. (Patient taking differently: Take 500 mg by mouth every 6 (six) hours as needed for headache.) 30 tablet 0   Chlorpheniramine Maleate (ALLERGY PO) Take 1 tablet by mouth once.     ibuprofen (ADVIL,MOTRIN) 600 MG tablet Take 1 tablet (600 mg total) by mouth every 6 (six) hours. 30 tablet 0   Prenatal Vit-Fe Fumarate-FA (PRENATAL MULTIVITAMIN) TABS tablet Take 1 tablet by mouth daily at 12 noon.     cephALEXin (KEFLEX) 500 MG capsule Take 1 capsule (500 mg  total) by mouth 2 (two) times daily. (Patient not taking: Reported on 11/25/2020) 10 capsule 0   ciprofloxacin (CIPRO) 500 MG tablet Take 1 tablet (500 mg total) by mouth every 12 (twelve) hours. (Patient not taking: Reported on 05/26/2021) 10 tablet 0   No facility-administered medications prior to visit.    No Known Allergies  Review of Systems  Constitutional:  Negative for chills, fever and malaise/fatigue.       Generalized body aches, see HPI  HENT: Negative.    Eyes: Negative.   Respiratory:  Negative for cough and shortness of breath.   Cardiovascular:  Negative for chest pain, palpitations and leg swelling.  Gastrointestinal:  Negative for  abdominal pain, blood in stool, constipation, diarrhea, nausea and vomiting.  Genitourinary: Negative.        See HPI  Musculoskeletal:  Positive for myalgias. Negative for back pain, joint pain and neck pain.  Skin: Negative.   Neurological: Negative.   Psychiatric/Behavioral:  Negative for depression. The patient is not nervous/anxious.   All other systems reviewed and are negative.     Objective:    Physical Exam Vitals reviewed.  Constitutional:      General: She is not in acute distress.    Appearance: Normal appearance. She is obese.  HENT:     Head: Normocephalic.     Right Ear: Tympanic membrane, ear canal and external ear normal. There is no impacted cerumen.     Left Ear: Tympanic membrane, ear canal and external ear normal. There is no impacted cerumen.     Nose: Nose normal. No congestion or rhinorrhea.     Mouth/Throat:     Mouth: Mucous membranes are moist.     Pharynx: Oropharynx is clear. No oropharyngeal exudate or posterior oropharyngeal erythema.  Eyes:     General: No scleral icterus.       Right eye: No discharge.        Left eye: No discharge.     Extraocular Movements: Extraocular movements intact.     Conjunctiva/sclera: Conjunctivae normal.     Pupils: Pupils are equal, round, and reactive to light.  Neck:     Vascular: No carotid bruit.  Cardiovascular:     Rate and Rhythm: Normal rate and regular rhythm.     Pulses: Normal pulses.     Heart sounds: Normal heart sounds.     Comments: No obvious peripheral edema Pulmonary:     Effort: Pulmonary effort is normal.     Breath sounds: Normal breath sounds.  Abdominal:     General: Abdomen is flat. Bowel sounds are normal. There is no distension.     Palpations: Abdomen is soft. There is no mass.     Tenderness: There is no abdominal tenderness. There is no right CVA tenderness, left CVA tenderness, guarding or rebound.     Hernia: No hernia is present.  Musculoskeletal:        General: No swelling,  tenderness, deformity or signs of injury. Normal range of motion.     Cervical back: Normal range of motion and neck supple. No rigidity or tenderness.     Right lower leg: No edema.     Left lower leg: No edema.  Lymphadenopathy:     Cervical: No cervical adenopathy.  Skin:    General: Skin is warm and dry.     Capillary Refill: Capillary refill takes less than 2 seconds.  Neurological:     General: No focal deficit present.  Mental Status: She is alert and oriented to person, place, and time.     Cranial Nerves: No cranial nerve deficit.     Sensory: No sensory deficit.     Motor: No weakness.     Coordination: Coordination normal.     Gait: Gait normal.     Deep Tendon Reflexes: Reflexes normal.  Psychiatric:        Mood and Affect: Mood normal.        Behavior: Behavior normal.        Thought Content: Thought content normal.        Judgment: Judgment normal.    BP 130/72    Pulse 60    Temp 98 F (36.7 C)    Ht 5' 3"  (1.6 m)    Wt 178 lb 6 oz (80.9 kg)    LMP 04/20/2021 (Exact Date)    SpO2 98%    BMI 31.60 kg/m  Wt Readings from Last 3 Encounters:  05/26/21 178 lb 6 oz (80.9 kg)  05/24/14 176 lb (79.8 kg)  12/03/13 141 lb 8 oz (64.2 kg)    Immunization History  Administered Date(s) Administered   Influenza Whole 01/10/2009   Unspecified SARS-COV-2 Vaccination 09/20/2019, 10/25/2019    Diabetic Foot Exam - Simple   No data filed     Lab Results  Component Value Date   TSH 1.780 05/26/2021   Lab Results  Component Value Date   WBC 10.4 05/26/2021   HGB 13.1 05/26/2021   HCT 40.9 05/26/2021   MCV 81 05/26/2021   PLT 253 05/26/2021   Lab Results  Component Value Date   NA 139 05/26/2021   K 3.8 05/26/2021   CO2 25 05/26/2021   GLUCOSE 124 (H) 05/26/2021   BUN 14 05/26/2021   CREATININE 0.82 05/26/2021   BILITOT 0.3 05/26/2021   ALKPHOS 129 (H) 05/26/2021   AST 6 05/26/2021   ALT 13 05/26/2021   PROT 7.3 05/26/2021   ALBUMIN 4.7 05/26/2021    CALCIUM 9.5 05/26/2021   ANIONGAP 13 12/03/2013   EGFR 90 05/26/2021   Lab Results  Component Value Date   CHOL 174 05/26/2021   CHOL 136 07/03/2009   Lab Results  Component Value Date   HDL 50 05/26/2021   HDL 56 07/03/2009   Lab Results  Component Value Date   LDLCALC 98 05/26/2021   LDLCALC 71 07/03/2009   Lab Results  Component Value Date   TRIG 146 05/26/2021   TRIG 47 07/03/2009   Lab Results  Component Value Date   CHOLHDL 3.5 05/26/2021   CHOLHDL 2.4 Ratio 07/03/2009   Lab Results  Component Value Date   HGBA1C 6.0 (A) 05/26/2021   HGBA1C 6.0 05/26/2021   HGBA1C 6.0 05/26/2021   HGBA1C 6.0 05/26/2021       Assessment & Plan:   Problem List Items Addressed This Visit   None Visit Diagnoses     Health care maintenance    -  Primary   Relevant Orders   POCT glycosylated hemoglobin (Hb A1C) (Completed):6.0, prediabetes    POCT URINALYSIS DIP (CLINITEK) (Completed)   Encounter for wellness examination in adult       Relevant Orders   CBC with Differential/Platelet (Completed)   Comprehensive metabolic panel (Completed)   Lipid panel (Completed)   Vitamin D, 25-hydroxy (Completed) Encouraged continued diet and exercises efforts   Generalized body aches       Relevant Orders   TSH+T4F+T3Free (Completed)   Estrogens, Total (Completed)  Informed to continue taking OTC medication as needed for symptoms Additional treatment details pending study results    Follow up in 3 mths for reevaluation of body aches, sooner as need    I am having Azka Steger maintain her acetaminophen, prenatal multivitamin, Chlorpheniramine Maleate (ALLERGY PO), ibuprofen, cephALEXin, and ciprofloxacin.  No orders of the defined types were placed in this encounter.    Teena Dunk, NP

## 2021-05-26 NOTE — Patient Instructions (Signed)
You were seen today in the [facility] to establish care and generalized body aches. Labs were collected, results will be available via MyChart or, if abnormal, you will be contacted by clinic staff.  Please follow up in 3 mths for reevaluation of body aches.

## 2021-05-30 LAB — COMPREHENSIVE METABOLIC PANEL
ALT: 13 IU/L (ref 0–32)
AST: 6 IU/L (ref 0–40)
Albumin/Globulin Ratio: 1.8 (ref 1.2–2.2)
Albumin: 4.7 g/dL (ref 3.8–4.8)
Alkaline Phosphatase: 129 IU/L — ABNORMAL HIGH (ref 44–121)
BUN/Creatinine Ratio: 17 (ref 9–23)
BUN: 14 mg/dL (ref 6–24)
Bilirubin Total: 0.3 mg/dL (ref 0.0–1.2)
CO2: 25 mmol/L (ref 20–29)
Calcium: 9.5 mg/dL (ref 8.7–10.2)
Chloride: 101 mmol/L (ref 96–106)
Creatinine, Ser: 0.82 mg/dL (ref 0.57–1.00)
Globulin, Total: 2.6 g/dL (ref 1.5–4.5)
Glucose: 124 mg/dL — ABNORMAL HIGH (ref 70–99)
Potassium: 3.8 mmol/L (ref 3.5–5.2)
Sodium: 139 mmol/L (ref 134–144)
Total Protein: 7.3 g/dL (ref 6.0–8.5)
eGFR: 90 mL/min/{1.73_m2} (ref >59)

## 2021-05-30 LAB — LIPID PANEL
Chol/HDL Ratio: 3.5 ratio (ref 0.0–4.4)
Cholesterol, Total: 174 mg/dL (ref 100–199)
HDL: 50 mg/dL (ref >39)
LDL Chol Calc (NIH): 98 mg/dL (ref 0–99)
Triglycerides: 146 mg/dL (ref 0–149)
VLDL Cholesterol Cal: 26 mg/dL (ref 5–40)

## 2021-05-30 LAB — VITAMIN D 25 HYDROXY (VIT D DEFICIENCY, FRACTURES): Vit D, 25-Hydroxy: 40.2 ng/mL (ref 30.0–100.0)

## 2021-05-30 LAB — CBC WITH DIFFERENTIAL/PLATELET
Basophils Absolute: 0 10*3/uL (ref 0.0–0.2)
Basos: 0 %
EOS (ABSOLUTE): 0.1 10*3/uL (ref 0.0–0.4)
Eos: 1 %
Hematocrit: 40.9 % (ref 34.0–46.6)
Hemoglobin: 13.1 g/dL (ref 11.1–15.9)
Immature Grans (Abs): 0 10*3/uL (ref 0.0–0.1)
Immature Granulocytes: 0 %
Lymphocytes Absolute: 2.9 10*3/uL (ref 0.7–3.1)
Lymphs: 28 %
MCH: 25.8 pg — ABNORMAL LOW (ref 26.6–33.0)
MCHC: 32 g/dL (ref 31.5–35.7)
MCV: 81 fL (ref 79–97)
Monocytes Absolute: 0.7 10*3/uL (ref 0.1–0.9)
Monocytes: 7 %
Neutrophils Absolute: 6.7 10*3/uL (ref 1.4–7.0)
Neutrophils: 64 %
Platelets: 253 10*3/uL (ref 150–450)
RBC: 5.08 x10E6/uL (ref 3.77–5.28)
RDW: 14.4 % (ref 11.7–15.4)
WBC: 10.4 10*3/uL (ref 3.4–10.8)

## 2021-05-30 LAB — ESTROGENS, TOTAL: Estrogen: 192 pg/mL

## 2021-05-30 LAB — TSH+T4F+T3FREE
Free T4: 1.07 ng/dL (ref 0.82–1.77)
T3, Free: 3.1 pg/mL (ref 2.0–4.4)
TSH: 1.78 u[IU]/mL (ref 0.450–4.500)

## 2021-08-24 ENCOUNTER — Ambulatory Visit: Payer: Self-pay | Admitting: Nurse Practitioner

## 2021-09-21 ENCOUNTER — Ambulatory Visit: Payer: Self-pay | Admitting: Nurse Practitioner

## 2022-03-30 ENCOUNTER — Encounter (HOSPITAL_COMMUNITY): Payer: Self-pay | Admitting: *Deleted

## 2022-03-30 ENCOUNTER — Ambulatory Visit (HOSPITAL_COMMUNITY)
Admission: EM | Admit: 2022-03-30 | Discharge: 2022-03-30 | Disposition: A | Discharge status: Home / Self Care | Payer: Self-pay | Attending: Family Medicine | Admitting: Family Medicine

## 2022-03-30 DIAGNOSIS — T7840XA Allergy, unspecified, initial encounter: Secondary | ICD-10-CM | POA: Insufficient documentation

## 2022-03-30 DIAGNOSIS — J029 Acute pharyngitis, unspecified: Secondary | ICD-10-CM | POA: Insufficient documentation

## 2022-03-30 LAB — POCT RAPID STREP A, ED / UC: Streptococcus, Group A Screen (Direct): NEGATIVE

## 2022-03-30 MED ORDER — METHYLPREDNISOLONE SODIUM SUCC 125 MG IJ SOLR
INTRAMUSCULAR | Status: AC
  Filled 2022-03-30: qty 2

## 2022-03-30 MED ORDER — METHYLPREDNISOLONE SODIUM SUCC 125 MG IJ SOLR
80.0000 mg | Freq: Once | INTRAMUSCULAR | Status: AC
  Administered 2022-03-30: 80 mg via INTRAMUSCULAR

## 2022-03-30 MED ORDER — METHYLPREDNISOLONE 4 MG PO TBPK: ORAL_TABLET | ORAL | 0 refills | Status: DC

## 2022-03-30 NOTE — ED Provider Notes (Signed)
[facility]    CSN: [id number] Arrival date & time: 03/30/22  0848      History   Chief Complaint Chief Complaint  Patient presents with   Rash   Allergic Reaction    HPI Nicole Page is a 46 y.o. female.   Patient is here for possible allergic reaction.  She has had a sore throat that started about 2 days.  Also with runny nose, congestion, drainage. No cough.  Fevers as well, with headache.  She took benadryl for allergies, and yesterday took a dose of an antibiotic from [place].  After that she started with itching and rash, as well as "shaking".  This started yesterday morning.  She has not taken anything since.  No n/v currently but had some nausea in the middle of the night.  The rash/itching has not really changed much.  No sob or difficulty swallowing.   The pill was PCN per the patient.  Before taking the abx she did have a fruit salad, but has eaten that before.        History reviewed. No pertinent past medical history.  Patient Active Problem List   Diagnosis Date Noted   Active labor at term 05/24/2014   Abnormal uterine bleeding (AUB) 07/24/2013    Past Surgical History:  Procedure Laterality Date   NO PAST SURGERIES      OB History     Gravida  4   Para  4   Term  4   Preterm  0   AB  0   Living  4      SAB  0   IAB  0   Ectopic  0   Multiple  0   Live Births  4            Home Medications    Prior to Admission medications   Medication Sig Start Date End Date Taking? Authorizing Provider  acetaminophen (TYLENOL) 500 MG tablet Take 1 tablet (500 mg total) by mouth every 6 (six) hours as needed. Patient taking differently: Take 500 mg by mouth every 6 (six) hours as needed for headache. 12/03/13   Piepenbrink, Victorino Dike, PA-C  Chlorpheniramine Maleate (ALLERGY PO) Take 1 tablet by mouth once.    [provider]  ibuprofen (ADVIL,MOTRIN) 600 MG tablet Take 1 tablet (600 mg total) by mouth  every 6 (six) hours. 05/26/14   Constant, Peggy, MD  Prenatal Vit-Fe Fumarate-FA (PRENATAL MULTIVITAMIN) TABS tablet Take 1 tablet by mouth daily at 12 noon.    [provider]    Family History Family History  Problem Relation Age of Onset   CAD Mother    Hyperlipidemia Father     Social History Social History   Tobacco Use   Smoking status: Never   Smokeless tobacco: Never  Vaping Use   Vaping Use: Never used  Substance Use Topics   Alcohol use: No   Drug use: No     Allergies   Patient has no known allergies.   Review of Systems Review of Systems  Constitutional:  Positive for fever.  HENT:  Positive for congestion, rhinorrhea and sore throat.   Cardiovascular: Negative.   Gastrointestinal: Negative.   Musculoskeletal: Negative.   Skin:  Positive for rash.  Psychiatric/Behavioral: Negative.       Physical Exam Triage Vital Signs ED Triage Vitals  Enc Vitals Group     BP 03/30/22 1034 115/80     Pulse Rate 03/30/22 1034 (!) 114  Resp 03/30/22 1034 18     Temp 03/30/22 1034 (!) 100.9 F (38.3 C)     Temp Source 03/30/22 1034 Oral     SpO2 03/30/22 1034 98 %     Weight --      Height --      Head Circumference --      Peak Flow --      Pain Score 03/30/22 1031 0     Pain Loc --      Pain Edu? --      Excl. in GC? --    No data found.  Updated Vital Signs BP 115/80 (BP Location: Right Arm)   Pulse (!) 114   Temp (!) 100.9 F (38.3 C) (Oral)   Resp 18   LMP 01/12/2022 Comment: irregular  SpO2 98%   Visual Acuity Right Eye Distance:   Left Eye Distance:   Bilateral Distance:    Right Eye Near:   Left Eye Near:    Bilateral Near:     Physical Exam Constitutional:      General: She is not in acute distress.    Appearance: Normal appearance. She is not ill-appearing or diaphoretic.  HENT:     Mouth/Throat:     Mouth: Mucous membranes are moist.     Pharynx: Posterior oropharyngeal erythema present. No oropharyngeal exudate.   Cardiovascular:     Rate and Rhythm: Normal rate and regular rhythm.  Pulmonary:     Effort: Pulmonary effort is normal.     Breath sounds: Normal breath sounds.  Musculoskeletal:     Cervical back: Normal range of motion and neck supple. No tenderness.  Lymphadenopathy:     Cervical: No cervical adenopathy.  Skin:    Comments: There is a generalized red rash to the abdomen, chest, arms and legs;  Neurological:     General: No focal deficit present.     Mental Status: She is alert.  Psychiatric:        Mood and Affect: Mood normal.      UC Treatments / Results  Labs (all labs ordered are listed, but only abnormal results are displayed) Labs Reviewed  CULTURE, GROUP A STREP [facility])  POCT RAPID STREP A, ED / UC    EKG   Radiology No results found.  Procedures Procedures (including critical care time)  Medications Ordered in UC Medications  methylPREDNISolone sodium succinate (SOLU-MEDROL) 125 mg/2 mL injection 80 mg (has no administration in time range)    Initial Impression / Assessment and Plan / UC Course  I have reviewed the triage vital signs and the nursing notes.  Pertinent labs & imaging results that were available during my care of the patient were reviewed by me and considered in my medical decision making (see chart for details).   Final Clinical Impressions(s) / UC Diagnoses   Final diagnoses:  Allergic reaction, initial encounter  Sore throat     Discharge Instructions      Fue atendida hoy por una reaccin alrgica. Le puse una inyeccin de esteroides hoy y envi un paquete de esteroides orales a Garment/textile technologist. Por favor comience esto maana. Tambin puede tomar benadryl para el sarpullido y Higher education careers adviser. Tu prueba de estreptococo fue negativa. Esto se enviar para cultivo y, si es positivo, lo llamaremos para recibir antibiticos. Isaac Bliss, recomiendo hacer grgaras con tylenol/motrin o agua salada para el dolor de garganta.  She was seen  today for allergic reaction.  I have given her a shot of  a steroid today, and sent out an oral steroid pack to the pharmacy.  Please start this tomorrow.  You may take benadryl as well for rash and itch.  Your strep test was negative.  This will be sent for culture and if positive we will call you for antibiotics.  In the mean time I recommend tylenol/motrin or salt water gargles for your sore throat.     ED Prescriptions     Medication Sig Dispense Auth. Provider   methylPREDNISolone (MEDROL DOSEPAK) 4 MG TBPK tablet Take as directed 1 each Jannifer Franklin, MD      PDMP not reviewed this encounter.   Jannifer Franklin, MD 03/30/22 1053

## 2022-03-30 NOTE — Discharge Instructions (Addendum)
Fue atendida hoy por una reaccin alrgica. Le puse una inyeccin de esteroides hoy y envi un paquete de esteroides orales a Garment/textile technologist. Por favor comience esto maana. Tambin puede tomar benadryl para el sarpullido y Higher education careers adviser. Tu prueba de estreptococo fue negativa. Esto se enviar para cultivo y, si es positivo, lo llamaremos para recibir antibiticos. Nicole Page, recomiendo hacer grgaras con tylenol/motrin o agua salada para el dolor de garganta.  She was seen today for allergic reaction.  I have given her a shot of a steroid today, and sent out an oral steroid pack to the pharmacy.  Please start this tomorrow.  You may take benadryl as well for rash and itch.  Your strep test was negative.  This will be sent for culture and if positive we will call you for antibiotics.  In the mean time I recommend tylenol/motrin or salt water gargles for your sore throat.

## 2022-03-30 NOTE — ED Triage Notes (Signed)
Pt states through daughter translating. Pt states she took a antibiotic from [place] once yesterday for a sore throat and she woke up at midnight with a rash and red all over her body. She is very itchy and dizzy. She complains of headache and a sore throat She denies being SOB and is holding a conversation in clinic.

## 2022-04-01 LAB — CULTURE, GROUP A STREP (THRC)

## 2022-04-19 ENCOUNTER — Ambulatory Visit (HOSPITAL_COMMUNITY): Payer: Self-pay

## 2022-04-20 ENCOUNTER — Telehealth: Payer: Self-pay

## 2022-04-20 NOTE — Telephone Encounter (Signed)
Transition Care Management Follow-up Telephone Call Date of discharge and from where: 04/19/22 How have you been since you were released from the hospital? Per pt she is better. Any questions or concerns? No  Items Reviewed: Did the pt receive and understand the discharge instructions provided? Yes  Medications obtained and verified? Yes  Other? No  Any new allergies since your discharge? No  Dietary orders reviewed? Yes Do you have support at home? Yes    Follow up appointments reviewed:  PCP Hospital f/u appt confirmed? Yes  Scheduled to see Fola on 04/23/22 @ 8 am. [facility] f/u appt confirmed? No   Are transportation arrangements needed? Yes  If their condition worsens, is the pt aware to call PCP or go to the Emergency Dept.? Yes Was the patient provided with contact information for the PCP's office or ED? Yes Was to pt encouraged to call back with questions or concerns? Yes    Elyse Jarvis RMA

## 2022-04-23 ENCOUNTER — Inpatient Hospital Stay: Payer: Self-pay | Admitting: Nurse Practitioner

## 2022-04-23 NOTE — Progress Notes (Deleted)
[facility] [place] [address]                                   Transitional Care Clinic   Post Hospital Discharge Acute Issues Care Follow Up                                                                        Patient Demographics  Nicole Page, is a 47 y.o. female  DOB 05-21-1975  MRN [id number].  Primary MD  Teena Dunk, NP   Reason for TCC follow Up - ***   No past medical history on file.  Past Surgical History:  Procedure Laterality Date   NO PAST SURGERIES         Recent HPI and Hospital Course  ***  [facility] Acute Care Issue to be followed in the Clinic   ***   Subjective:   Nicole Page today has, No headache, No chest pain, No abdominal pain - No Nausea, No new weakness tingling or numbness, No Cough - SOB. ********  Assessment & Plan    There are no diagnoses linked to this encounter.   Reason for frequent admissions/ER visits **      Objective:   There were no vitals filed for this visit.  Wt Readings from Last 3 Encounters:  05/26/21 178 lb 6 oz (80.9 kg)  05/24/14 176 lb (79.8 kg)  12/03/13 141 lb 8 oz (64.2 kg)    Allergies as of 04/23/2022       Reactions   Penicillins Rash        Medication List        Accurate as of April 23, 2022  8:17 AM. If you have any questions, ask your nurse or doctor.          acetaminophen 500 MG tablet Commonly known as: TYLENOL Take 1 tablet (500 mg total) by mouth every 6 (six) hours as needed. What changed: reasons to take this   ALLERGY PO Take 1 tablet by mouth once.   ibuprofen 600 MG tablet Commonly known as: ADVIL Take 1 tablet (600 mg total) by mouth every 6 (six) hours.   methylPREDNISolone 4 MG Tbpk tablet Commonly known as: MEDROL DOSEPAK Take as directed   prenatal multivitamin Tabs tablet Take 1 tablet by mouth daily at 12 noon.         Physical  Exam: Constitutional: Patient appears well-developed and well-nourished. Not in obvious distress. HENT: Normocephalic, atraumatic, External right and left ear normal. Oropharynx is clear and moist.  Eyes: Conjunctivae and EOM are normal. PERRLA, no scleral icterus. Neck: Normal ROM. Neck supple. No JVD. No tracheal deviation. No thyromegaly. CVS: RRR, S1/S2 +, no murmurs, no gallops, no carotid bruit.  Pulmonary: Effort and breath sounds normal, no stridor, rhonchi, wheezes, rales.  Abdominal: Soft. BS +, no distension, tenderness, rebound or guarding.  Musculoskeletal: Normal range of motion. No edema and no tenderness.  Lymphadenopathy: No lymphadenopathy noted, cervical, inguinal or axillary Neuro: Alert. Normal reflexes, muscle tone coordination. No cranial nerve deficit. Skin: Skin is warm and  dry. No rash noted. Not diaphoretic. No erythema. No pallor. Psychiatric: Normal mood and affect. Behavior, judgment, thought content normal.   Data Review   Micro Results No results found for this or any previous visit (from the past 240 hour(s)).   CBC No results for input(s): "WBC", "HGB", "HCT", "PLT", "MCV", "MCH", "MCHC", "RDW", "LYMPHSABS", "MONOABS", "EOSABS", "BASOSABS", "BANDABS" in the last 168 hours.  Invalid input(s): "NEUTRABS", "BANDSABD"  Chemistries  No results for input(s): "NA", "K", "CL", "CO2", "GLUCOSE", "BUN", "CREATININE", "CALCIUM", "MG", "AST", "ALT", "ALKPHOS", "BILITOT" in the last 168 hours.  Invalid input(s): "GFRCGP" ------------------------------------------------------------------------------------------------------------------ CrCl cannot be calculated (Patient's most recent lab result is older than the maximum 21 days allowed.). ------------------------------------------------------------------------------------------------------------------ No results for input(s): "HGBA1C" in the last 72  hours. ------------------------------------------------------------------------------------------------------------------ No results for input(s): "CHOL", "HDL", "LDLCALC", "TRIG", "CHOLHDL", "LDLDIRECT" in the last 72 hours. ------------------------------------------------------------------------------------------------------------------ No results for input(s): "TSH", "T4TOTAL", "T3FREE", "THYROIDAB" in the last 72 hours.  Invalid input(s): "FREET3" ------------------------------------------------------------------------------------------------------------------ No results for input(s): "VITAMINB12", "FOLATE", "FERRITIN", "TIBC", "IRON", "RETICCTPCT" in the last 72 hours.  Coagulation profile No results for input(s): "INR", "PROTIME" in the last 168 hours.  No results for input(s): "DDIMER" in the last 72 hours.  Cardiac Enzymes No results for input(s): "CKMB", "TROPONINI", "MYOGLOBIN" in the last 168 hours.  Invalid input(s): "CK" ------------------------------------------------------------------------------------------------------------------ Invalid input(s): "POCBNP"   Time Spent in minutes  45 ***   Elyse Jarvis M.D on 04/23/2022 at 8:17 AM   **Disclaimer: This note may have been dictated with voice recognition software. Similar sounding words can inadvertently be transcribed and this note may contain transcription errors which may not have been corrected upon publication of note.**

## 2022-05-18 ENCOUNTER — Ambulatory Visit (INDEPENDENT_AMBULATORY_CARE_PROVIDER_SITE_OTHER): Payer: Self-pay | Admitting: Nurse Practitioner

## 2022-05-18 ENCOUNTER — Encounter: Payer: Self-pay | Admitting: Nurse Practitioner

## 2022-05-18 ENCOUNTER — Emergency Department (HOSPITAL_COMMUNITY)
Admission: EM | Admit: 2022-05-18 | Discharge: 2022-05-18 | Disposition: A | Discharge status: Home / Self Care | Payer: Self-pay | Attending: Emergency Medicine | Admitting: Emergency Medicine

## 2022-05-18 ENCOUNTER — Other Ambulatory Visit: Payer: Self-pay

## 2022-05-18 ENCOUNTER — Encounter (HOSPITAL_COMMUNITY): Payer: Self-pay

## 2022-05-18 VITALS — BP 111/60 | HR 76 | Temp 97.5°F | Ht 60.0 in | Wt 176.2 lb

## 2022-05-18 DIAGNOSIS — N912 Amenorrhea, unspecified: Secondary | ICD-10-CM

## 2022-05-18 DIAGNOSIS — N760 Acute vaginitis: Secondary | ICD-10-CM

## 2022-05-18 DIAGNOSIS — T7840XA Allergy, unspecified, initial encounter: Secondary | ICD-10-CM | POA: Insufficient documentation

## 2022-05-18 DIAGNOSIS — Z1211 Encounter for screening for malignant neoplasm of colon: Secondary | ICD-10-CM

## 2022-05-18 DIAGNOSIS — Z0000 Encounter for general adult medical examination without abnormal findings: Secondary | ICD-10-CM

## 2022-05-18 DIAGNOSIS — R5383 Other fatigue: Secondary | ICD-10-CM

## 2022-05-18 DIAGNOSIS — E669 Obesity, unspecified: Secondary | ICD-10-CM

## 2022-05-18 DIAGNOSIS — R079 Chest pain, unspecified: Secondary | ICD-10-CM

## 2022-05-18 DIAGNOSIS — R7303 Prediabetes: Secondary | ICD-10-CM

## 2022-05-18 LAB — GROUP A STREP BY PCR: Group A Strep by PCR: NOT DETECTED

## 2022-05-18 MED ORDER — DIPHENHYDRAMINE HCL 50 MG/ML IJ SOLN
25.0000 mg | Freq: Once | INTRAMUSCULAR | Status: AC
  Administered 2022-05-18: 25 mg via INTRAVENOUS
  Filled 2022-05-18: qty 1

## 2022-05-18 MED ORDER — FAMOTIDINE IN NACL 20-0.9 MG/50ML-% IV SOLN
20.0000 mg | Freq: Once | INTRAVENOUS | Status: AC
  Administered 2022-05-18: 20 mg via INTRAVENOUS
  Filled 2022-05-18: qty 50

## 2022-05-18 MED ORDER — METHYLPREDNISOLONE SODIUM SUCC 125 MG IJ SOLR
125.0000 mg | Freq: Once | INTRAMUSCULAR | Status: AC
  Administered 2022-05-18: 125 mg via INTRAVENOUS
  Filled 2022-05-18: qty 2

## 2022-05-18 MED ORDER — PREDNISONE 20 MG PO TABS: ORAL_TABLET | ORAL | 0 refills | Status: DC

## 2022-05-18 MED ORDER — FAMOTIDINE 20 MG PO TABS: 20.0000 mg | ORAL_TABLET | Freq: Two times a day (BID) | ORAL | 0 refills | Status: DC

## 2022-05-18 NOTE — Assessment & Plan Note (Signed)
Since the past 4 months She does not think she is pregnant  She might be in premenopausal stage considering her age.

## 2022-05-18 NOTE — ED Notes (Signed)
Patient reports she was tasting daughter's amoxicillin (liquid) 3-4 hours ago at home and then started feeling itchy, having sore throat and little bit of SOB. Endorses rash to bilateral arms. Patient in no distress at this time, denies difficulty swallowing or tongue swelling.

## 2022-05-18 NOTE — ED Notes (Signed)
Patient given discharge instructions and follow up care. Patient verbalized understanding. IV removed with catheter intact. Patient ambulatory out of ED.

## 2022-05-18 NOTE — Progress Notes (Addendum)
New Patient Office Visit  Subjective:  Patient ID: Nicole Page, female    DOB: October 17, 1975  Age: 47 y.o. MRN: [id number]  CC:  Chief Complaint  Patient presents with   Hypertension   Dizziness   Fatigue    Chest pressure.   Vaginal Itching    Vaginal burning.    HPI Nicole Page is a 47 y.o. female with past medical history of abnormal uterine bleeding presents with complains of fatigue, dizziness, tiredness since the past 3 days , sometimes has right sided chest pain after doing a lot of work . She was treated for flu on 04/19/2022 stated that she has completed full course of  tamiflu  ordered . She currently denies fever, chills, SOB, wheezing, cough, acid refulx, seizures, loss of apetite unintentional weight loss.  She does not excecrice. Her last menstrual period was 4 months ago, she thinks she might be in premenopausal stage.    Patient c/o Vaginal itching since the past 4 days. She denies vaginal  discharge, dysuria, rashes, abdominal pain, nausea, vomiting , has one sexual partner- her husband.   Due for colon cancer screening Cologuard test ordered today , form given to the patient for scholarship assistance for mammogram since she does not have insurance.   Language interpreter utilized via [employer].   Patient was last seen at this office about a year ago by Bo Merino, NP   History reviewed. No pertinent past medical history.  Past Surgical History:  Procedure Laterality Date   NO PAST SURGERIES      Family History  Problem Relation Age of Onset   CAD Mother    Hyperlipidemia Father    Cancer Father    Breast cancer Neg Hx     Social History   Socioeconomic History   Marital status: Single    Spouse name: Not on file   Number of children: 4   Years of education: Not on file   Highest education level: Not on file  Occupational History   Not on file  Tobacco Use   Smoking status: Never   Smokeless tobacco: Never   Vaping Use   Vaping Use: Never used  Substance and Sexual Activity   Alcohol use: No   Drug use: No   Sexual activity: Yes    Birth control/protection: Condom  Other Topics Concern   Not on file  Social History Narrative   Lives with her husband    Social Determinants of Health   Financial Resource Strain: Not on file  Food Insecurity: Not on file  Transportation Needs: Not on file  Physical Activity: Not on file  Stress: Not on file  Social Connections: Not on file  Intimate Partner Violence: Not on file    ROS Review of Systems  Constitutional:  Positive for fatigue. Negative for activity change, appetite change, chills, diaphoresis, fever and unexpected weight change.  Respiratory: Negative.  Negative for apnea, cough, choking, chest tightness and shortness of breath.   Cardiovascular: Negative.  Negative for chest pain, palpitations and leg swelling.  Gastrointestinal: Negative.  Negative for abdominal distention, abdominal pain, anal bleeding, blood in stool and constipation.  Genitourinary:  Negative for dyspareunia, dysuria, enuresis, flank pain, frequency, genital sores and hematuria.  Musculoskeletal:  Positive for arthralgias. Negative for back pain, gait problem and joint swelling.  Neurological: Negative.  Negative for dizziness, facial asymmetry, light-headedness, numbness and headaches.  Psychiatric/Behavioral:  Negative for agitation, behavioral problems, confusion, decreased concentration and dysphoric  mood.     Objective:   Today's Vitals: BP 111/60   Pulse 76   Temp (!) 97.5 F (36.4 C)   Ht 5' (1.524 m)   Wt 176 lb 3.2 oz (79.9 kg)   LMP 01/12/2022 Comment: irregular  SpO2 100%   BMI 34.41 kg/m   Physical Exam Constitutional:      General: She is not in acute distress.    Appearance: Normal appearance. She is obese. She is not ill-appearing, toxic-appearing or diaphoretic.  Eyes:     General: No scleral icterus.       Right eye: No discharge.         Left eye: No discharge.     Extraocular Movements: Extraocular movements intact.     Conjunctiva/sclera: Conjunctivae normal.  Cardiovascular:     Rate and Rhythm: Normal rate and regular rhythm.     Pulses: Normal pulses.     Heart sounds: Normal heart sounds. No murmur heard.    No friction rub. No gallop.  Pulmonary:     Effort: Pulmonary effort is normal. No respiratory distress.     Breath sounds: Normal breath sounds. No stridor. No wheezing, rhonchi or rales.  Chest:     Chest wall: No tenderness.  Abdominal:     General: There is no distension.     Palpations: Abdomen is soft.     Tenderness: There is no abdominal tenderness. There is no guarding.  Musculoskeletal:        General: No swelling, tenderness, deformity or signs of injury.     Right lower leg: No edema.     Left lower leg: No edema.  Skin:    General: Skin is warm and dry.     Coloration: Skin is not jaundiced or pale.     Findings: No bruising or erythema.  Neurological:     Mental Status: She is alert and oriented to person, place, and time.     Cranial Nerves: No cranial nerve deficit.     Sensory: No sensory deficit.     Motor: No weakness.     Coordination: Coordination normal.     Gait: Gait normal.  Psychiatric:        Mood and Affect: Mood normal.        Behavior: Behavior normal.        Thought Content: Thought content normal.        Judgment: Judgment normal.     Assessment & Plan:   Problem List Items Addressed This Visit       Genitourinary   Acute vaginitis   Relevant Orders   NuSwab Vaginitis Plus (VG+)     Other   Health care maintenance   Relevant Orders   TSH   Vitamin D, 25-hydroxy   CBC with Differential   CMP14+EGFR   Hemoglobin A1c   Obesity (BMI 30-39.9)    Wt Readings from Last 3 Encounters:  05/18/22 176 lb 3.2 oz (79.9 kg)  05/26/21 178 lb 6 oz (80.9 kg)  05/24/14 176 lb (79.8 kg)  Patient counseled on low carb diet  She was encouraged to engage in  regular moderate to vigorous exercises at least 150 minutes weekly Benefits of exercises  and healthy weight dicussed.       Fatigue - Primary    Checking labs today  - TSH - Vitamin D, 25-hydroxy - CBC with Differential - CMP14+EGFR - Hemoglobin A1c  Need for regular moderate to vigorous exercises dicussed  Drink at  least 64 ounces of water daily to maintain hydration.       Relevant Orders   TSH   Vitamin D, 25-hydroxy   CBC with Differential   CMP14+EGFR   Hemoglobin A1c   Prediabetes    Lab Results  Component Value Date   HGBA1C 6.0 (A) 05/26/2021   HGBA1C 6.0 05/26/2021   HGBA1C 6.0 05/26/2021   HGBA1C 6.0 05/26/2021   Checking A1c Today  Avoid sugar sweets soda Loose weight       Relevant Orders   Hemoglobin A1c   Right-sided chest pain    She currently denies pain Pain musculoskeletal in nature  Take OTC  tylenol as needed.       Amenorrhea    Since the past 4 months She does not think she is pregnant  She might be in premenopausal stage considering her age.       Other Visit Diagnoses     Screening for colon cancer       Relevant Orders   Cologuard       Outpatient Encounter Medications as of 05/18/2022  Medication Sig   acetaminophen (TYLENOL) 500 MG tablet Take 1 tablet (500 mg total) by mouth every 6 (six) hours as needed. (Patient taking differently: Take 500 mg by mouth every 6 (six) hours as needed for headache.)   Chlorpheniramine Maleate (ALLERGY PO) Take 1 tablet by mouth once.   Cholecalciferol (VITAMIN D-3) 25 MCG (1000 UT) CAPS Take by mouth.   ibuprofen (ADVIL,MOTRIN) 600 MG tablet Take 1 tablet (600 mg total) by mouth every 6 (six) hours.   Multiple Vitamin (MULTIVITAMIN) LIQD Take 5 mLs by mouth daily.   Prenatal Vit-Fe Fumarate-FA (PRENATAL MULTIVITAMIN) TABS tablet Take 1 tablet by mouth daily at 12 noon. (Patient not taking: Reported on 05/18/2022)   [DISCONTINUED] methylPREDNISolone (MEDROL DOSEPAK) 4 MG TBPK tablet Take as  directed (Patient not taking: Reported on 05/18/2022)   No facility-administered encounter medications on file as of 05/18/2022.    Follow-up: Return in about 3 months (around 08/16/2022) for fatigue.   Renee Rival, FNP

## 2022-05-18 NOTE — Assessment & Plan Note (Signed)
Checking labs today  - TSH - Vitamin D, 25-hydroxy - CBC with Differential - CMP14+EGFR - Hemoglobin A1c  Need for regular moderate to vigorous exercises dicussed  Drink at least 64 ounces of water daily to maintain hydration.

## 2022-05-18 NOTE — ED Provider Triage Note (Signed)
Emergency Medicine Provider Triage Evaluation Note  Nicole Page , a 47 y.o. female  was evaluated in triage.  Pt complains of concerns for allergic reaction onset today.  Patient has associated sore throat, feels as if she has difficulty breathing, chest tightness, nausea, rash.  No new medications, lotions, soaps, detergents.  Denies chest pain.  Review of Systems  Positive:  Negative:   Physical Exam  BP 135/79   Pulse 78   Temp 98 F (36.7 C)   Resp 20   Ht 5' (1.524 m)   Wt 79.9 kg   LMP 01/12/2022 Comment: irregular  SpO2 100%   BMI 34.40 kg/m  Gen:   Awake, no distress   Resp:  Normal effort  MSK:   Moves extremities without difficulty  Other:  Areas of erythema to bilateral upper extremities, more so to the right upper extremity.  Uvula midline without swelling.  No posterior pharyngeal erythema or tonsillar exudate noted.  Patent airway.  Able to speak in clear complete sentences.  Medical Decision Making  Medically screening exam initiated at 11:20 PM.  Appropriate orders placed.  Ethan Cederquist was informed that the remainder of the evaluation will be completed by another provider, this initial triage assessment does not replace that evaluation, and the importance of remaining in the ED until their evaluation is complete.  Workup initiated   Jovanny Stephanie A, PA-C 05/18/22 2320

## 2022-05-18 NOTE — Discharge Instructions (Signed)
Take Benadryl 25 mg every 4-6 hours as needed for itching or rash.  Follow-up with your family doctor next week for recheck

## 2022-05-18 NOTE — Assessment & Plan Note (Signed)
She currently denies pain Pain musculoskeletal in nature  Take OTC  tylenol as needed.

## 2022-05-18 NOTE — Assessment & Plan Note (Signed)
Lab Results  Component Value Date   HGBA1C 6.0 (A) 05/26/2021   HGBA1C 6.0 05/26/2021   HGBA1C 6.0 05/26/2021   HGBA1C 6.0 05/26/2021   Checking A1c Today  Avoid sugar sweets soda Loose weight

## 2022-05-18 NOTE — ED Provider Notes (Signed)
[place] EMERGENCY DEPARTMENT AT [facility] Provider Note   CSN: [id number] Arrival date & time: 05/18/22  2103     History  Chief Complaint  Patient presents with   Allergic Reaction    Nicole Page is a 47 y.o. female.  Patient presents with some swelling to face and itching along with itching to arms and chest.  The history is provided by the patient and medical records. No language interpreter was used.  Allergic Reaction Presenting symptoms: itching and rash   Presenting symptoms: no difficulty breathing   Severity:  Moderate Prior allergic episodes:  No prior episodes Context: not animal exposure   Relieved by:  Nothing Worsened by:  Nothing Ineffective treatments:  None tried      Home Medications Prior to Admission medications   Medication Sig Start Date End Date Taking? Authorizing Provider  famotidine (PEPCID) 20 MG tablet Take 1 tablet (20 mg total) by mouth 2 (two) times daily. 05/18/22  Yes Milton Ferguson, MD  predniSONE (DELTASONE) 20 MG tablet 2 tabs po daily x 3 days 05/18/22  Yes Milton Ferguson, MD  acetaminophen (TYLENOL) 500 MG tablet Take 1 tablet (500 mg total) by mouth every 6 (six) hours as needed. Patient taking differently: Take 500 mg by mouth every 6 (six) hours as needed for headache. 12/03/13   Piepenbrink, Anderson Malta, PA-C  Chlorpheniramine Maleate (ALLERGY PO) Take 1 tablet by mouth once.    [provider]  Cholecalciferol (VITAMIN D-3) 25 MCG (1000 UT) CAPS Take by mouth.    [provider]  ibuprofen (ADVIL,MOTRIN) 600 MG tablet Take 1 tablet (600 mg total) by mouth every 6 (six) hours. 05/26/14   Constant, Peggy, MD  Multiple Vitamin (MULTIVITAMIN) LIQD Take 5 mLs by mouth daily.    [provider]  Prenatal Vit-Fe Fumarate-FA (PRENATAL MULTIVITAMIN) TABS tablet Take 1 tablet by mouth daily at 12 noon. Patient not taking: Reported on 05/18/2022    [provider]      Allergies     Seasonal ic [octacosanol] and Penicillins    Review of Systems   Review of Systems  Constitutional:  Negative for appetite change and fatigue.  HENT:  Negative for congestion, ear discharge and sinus pressure.   Eyes:  Negative for discharge.  Respiratory:  Negative for cough.   Cardiovascular:  Negative for chest pain.  Gastrointestinal:  Negative for abdominal pain and diarrhea.  Genitourinary:  Negative for frequency and hematuria.  Musculoskeletal:  Negative for back pain.  Skin:  Positive for itching and rash.  Neurological:  Negative for seizures and headaches.  Psychiatric/Behavioral:  Negative for hallucinations.     Physical Exam Updated Vital Signs BP 135/79   Pulse 78   Temp 98 F (36.7 C)   Resp 20   Ht 5' (1.524 m)   Wt 79.9 kg   LMP 01/12/2022 Comment: irregular  SpO2 100%   BMI 34.40 kg/m  Physical Exam Vitals and nursing note reviewed.  Constitutional:      Appearance: She is well-developed.  HENT:     Head: Normocephalic.     Comments:  pharynx normal    Nose: Nose normal.  Eyes:     General: No scleral icterus.    Conjunctiva/sclera: Conjunctivae normal.  Neck:     Thyroid: No thyromegaly.  Cardiovascular:     Rate and Rhythm: Normal rate and regular rhythm.     Heart sounds: No murmur heard.    No friction rub. No gallop.  Pulmonary:     Breath sounds: No stridor. No wheezing or rales.  Chest:     Chest wall: No tenderness.  Abdominal:     General: There is no distension.     Tenderness: There is no abdominal tenderness. There is no rebound.  Musculoskeletal:        General: Normal range of motion.     Cervical back: Neck supple.  Lymphadenopathy:     Cervical: No cervical adenopathy.  Skin:    Findings: No erythema or rash.     Comments: Rash to chest abdomen face  Neurological:     Mental Status: She is alert and oriented to person, place, and time.     Motor: No abnormal muscle tone.     Coordination: Coordination normal.   Psychiatric:        Behavior: Behavior normal.     ED Results / Procedures / Treatments   Labs (all labs ordered are listed, but only abnormal results are displayed) Labs Reviewed  GROUP A STREP BY PCR    EKG None  Radiology No results found.  Procedures Procedures    Medications Ordered in ED Medications  famotidine (PEPCID) IVPB 20 mg premix (20 mg Intravenous New Bag/Given 05/18/22 2134)  diphenhydrAMINE (BENADRYL) injection 25 mg (25 mg Intravenous Given 05/18/22 2133)  methylPREDNISolone sodium succinate (SOLU-MEDROL) 125 mg/2 mL injection 125 mg (125 mg Intravenous Given 05/18/22 2133)    ED Course/ Medical Decision Making/ A&P                             Medical Decision Making Risk Prescription drug management.  This patient presents to the ED for concern of rash, this involves an extensive number of treatment options, and is a complaint that carries with it a high risk of complications and morbidity.  The differential diagnosis includes allergic reaction   Co morbidities that complicate the patient evaluation  None   Additional history obtained:  Additional history obtained from friend External records from outside source obtained and reviewed including hospital records   Lab Tests:  No labs Imaging Studies ordered:  No imaging  Cardiac Monitoring: / EKG:  The patient was maintained on a cardiac monitor.  I personally viewed and interpreted the cardiac monitored which showed an underlying rhythm of: Normal sinus rhythm   Consultations Obtained:  No consultant  Problem List / ED Course / Critical interventions / Medication management  Allergic reaction I ordered medication including Benadryl, Pepcid, steroids Reevaluation of the patient after these medicines showed that the patient improved I have reviewed the patients home medicines and have made adjustments as needed   Social Determinants of Health:  Does not speak  English   Test / Admission - Considered:  None  Patient with an allergic reaction improved with treatment in the emergency department.  She will be discharged home with Pepcid Benadryl and prednisone.  sHe will follow-up with her doctor next week        Final Clinical Impression(s) / ED Diagnoses Final diagnoses:  Allergic reaction, initial encounter    Rx / DC Orders ED Discharge Orders          Ordered    predniSONE (DELTASONE) 20 MG tablet        05/18/22 2232    famotidine (PEPCID) 20 MG tablet  2 times daily        05/18/22 2232  Milton Ferguson, MD 05/21/22 1012

## 2022-05-18 NOTE — Patient Instructions (Signed)
It is important that you exercise regularly at least 30 minutes 5 times a week as tolerated  Think about what you will eat, plan ahead. Choose " clean, green, fresh or frozen" over canned, processed or packaged foods which are more sugary, salty and fatty. 70 to 75% of food eaten should be vegetables and fruit. Three meals at set times with snacks allowed between meals, but they must be fruit or vegetables. Aim to eat over a 12 hour period , example 7 am to 7 pm, and STOP after  your last meal of the day. Drink water,generally about 64 ounces per day, no other drink is as healthy. Fruit juice is best enjoyed in a healthy way, by EATING the fruit.  Thanks for choosing [facility] we consider it a privelige to serve you.

## 2022-05-18 NOTE — Assessment & Plan Note (Signed)
Wt Readings from Last 3 Encounters:  05/18/22 176 lb 3.2 oz (79.9 kg)  05/26/21 178 lb 6 oz (80.9 kg)  05/24/14 176 lb (79.8 kg)  Patient counseled on low carb diet  She was encouraged to engage in regular moderate to vigorous exercises at least 150 minutes weekly Benefits of exercises  and healthy weight dicussed.

## 2022-05-18 NOTE — ED Triage Notes (Signed)
C/o rash to bilateral arms, sore throat, and sob.  Denies chest tightness.

## 2022-05-19 ENCOUNTER — Telehealth: Payer: Self-pay

## 2022-05-19 LAB — CBC WITH DIFFERENTIAL/PLATELET
Basophils Absolute: 0 10*3/uL (ref 0.0–0.2)
Basos: 0 %
EOS (ABSOLUTE): 0.2 10*3/uL (ref 0.0–0.4)
Eos: 1 %
Hematocrit: 40.5 % (ref 34.0–46.6)
Hemoglobin: 13.1 g/dL (ref 11.1–15.9)
Immature Grans (Abs): 0 10*3/uL (ref 0.0–0.1)
Immature Granulocytes: 0 %
Lymphocytes Absolute: 2.9 10*3/uL (ref 0.7–3.1)
Lymphs: 22 %
MCH: 26 pg — ABNORMAL LOW (ref 26.6–33.0)
MCHC: 32.3 g/dL (ref 31.5–35.7)
MCV: 80 fL (ref 79–97)
Monocytes Absolute: 0.9 10*3/uL (ref 0.1–0.9)
Monocytes: 7 %
Neutrophils Absolute: 8.9 10*3/uL — ABNORMAL HIGH (ref 1.4–7.0)
Neutrophils: 70 %
Platelets: 283 10*3/uL (ref 150–450)
RBC: 5.04 x10E6/uL (ref 3.77–5.28)
RDW: 14.3 % (ref 11.7–15.4)
WBC: 12.9 10*3/uL — ABNORMAL HIGH (ref 3.4–10.8)

## 2022-05-19 LAB — CMP14+EGFR
ALT: 11 IU/L (ref 0–32)
AST: 7 IU/L (ref 0–40)
Albumin/Globulin Ratio: 1.5 (ref 1.2–2.2)
Albumin: 4.4 g/dL (ref 3.9–4.9)
Alkaline Phosphatase: 131 IU/L — ABNORMAL HIGH (ref 44–121)
BUN/Creatinine Ratio: 16 (ref 9–23)
BUN: 12 mg/dL (ref 6–24)
Bilirubin Total: <0.2 mg/dL (ref 0.0–1.2)
CO2: 24 mmol/L (ref 20–29)
Calcium: 9.4 mg/dL (ref 8.7–10.2)
Chloride: 104 mmol/L (ref 96–106)
Creatinine, Ser: 0.74 mg/dL (ref 0.57–1.00)
Globulin, Total: 2.9 g/dL (ref 1.5–4.5)
Glucose: 117 mg/dL — ABNORMAL HIGH (ref 70–99)
Potassium: 4 mmol/L (ref 3.5–5.2)
Sodium: 142 mmol/L (ref 134–144)
Total Protein: 7.3 g/dL (ref 6.0–8.5)
eGFR: 102 mL/min/{1.73_m2} (ref >59)

## 2022-05-19 LAB — HEMOGLOBIN A1C
Est. average glucose Bld gHb Est-mCnc: 128 mg/dL
Hgb A1c MFr Bld: 6.1 % — ABNORMAL HIGH (ref 4.8–5.6)

## 2022-05-19 LAB — VITAMIN D 25 HYDROXY (VIT D DEFICIENCY, FRACTURES): Vit D, 25-Hydroxy: 38.4 ng/mL (ref 30.0–100.0)

## 2022-05-19 LAB — TSH: TSH: 1.45 u[IU]/mL (ref 0.450–4.500)

## 2022-05-19 NOTE — Progress Notes (Signed)
Your WBC cells and neutrophil count are elevated, Infections by bacteria,viruses or  allergic reactions, may all increase the number of neutrophils in the blood.   Liver enzymes is slightly elevated, avoid alcohol, excessive use of tylenol, we will recheck your labs in 3 months.   Other labs are normal.    Prediabetes, avoid sugar sweet soda.

## 2022-05-19 NOTE — Transitions of Care (Post Inpatient/ED Visit) (Signed)
   05/19/2022  Name: Nicole Page MRN: [id number] DOB: [birth year]  Today's TOC FU Call Status: Today's TOC FU Call Status:: Unsuccessul Call (1st Attempt) Unsuccessful Call (1st Attempt) Date: 05/18/22  Attempted to reach the patient regarding the most recent Inpatient/ED visit.  Follow Up Plan: Additional outreach attempts will be made to reach the patient to complete the Transitions of Care (Post Inpatient/ED visit) call.   [place] RMA

## 2022-05-26 NOTE — Progress Notes (Signed)
Called pt and inform results.

## 2022-06-29 ENCOUNTER — Encounter: Payer: Self-pay | Admitting: Nurse Practitioner

## 2022-06-29 ENCOUNTER — Ambulatory Visit (INDEPENDENT_AMBULATORY_CARE_PROVIDER_SITE_OTHER): Payer: Self-pay | Admitting: Nurse Practitioner

## 2022-06-29 VITALS — BP 115/75 | HR 71 | Temp 97.8°F | Ht 62.0 in | Wt 170.4 lb

## 2022-06-29 DIAGNOSIS — R079 Chest pain, unspecified: Secondary | ICD-10-CM

## 2022-06-29 DIAGNOSIS — K219 Gastro-esophageal reflux disease without esophagitis: Secondary | ICD-10-CM

## 2022-06-29 MED ORDER — PANTOPRAZOLE SODIUM 40 MG PO TBEC: 40.0000 mg | DELAYED_RELEASE_TABLET | Freq: Every day | ORAL | 3 refills | Status: AC

## 2022-06-29 NOTE — Patient Instructions (Signed)
1. Gastroesophageal reflux disease, unspecified whether esophagitis present  - pantoprazole (PROTONIX) 40 MG tablet; Take 1 tablet (40 mg total) by mouth daily.  Dispense: 30 tablet; Refill: 3  It is important that you exercise regularly at least 30 minutes 5 times a week as tolerated  Think about what you will eat, plan ahead. Choose " clean, green, fresh or frozen" over canned, processed or packaged foods which are more sugary, salty and fatty. 70 to 75% of food eaten should be vegetables and fruit. Three meals at set times with snacks allowed between meals, but they must be fruit or vegetables. Aim to eat over a 12 hour period , example 7 am to 7 pm, and STOP after  your last meal of the day. Drink water,generally about 64 ounces per day, no other drink is as healthy. Fruit juice is best enjoyed in a healthy way, by EATING the fruit.  Thanks for choosing [facility] we consider it a privelige to serve you.

## 2022-06-29 NOTE — Assessment & Plan Note (Signed)
EKG shows normal sinus rhythm rate of 61 bpm I have low concern for ischemia Starting Protonix for acid reflux Take Tylenol 650 mg every 6 hours as needed for chest tenderness.

## 2022-06-29 NOTE — Progress Notes (Signed)
Acute Office Visit  Subjective:     Patient ID: Nicole Page, female    DOB: [birth year], 47 y.o.   MRN: [id number]  Chief Complaint  Patient presents with   Gastroesophageal Reflux    Pt is experiencing acid reflux especially at nigh time. Started a month ago, sore throat , and chest pressure.    Gastroesophageal Reflux She complains of heartburn. She reports no abdominal pain, no coughing, no nausea or no wheezing. Pertinent negatives include no melena.   Ms. Nicole Page has a past medical history of Elevated ALT measurement, Obesity, and Prediabetes.  Patient presents with complaints of acid reflux worse at night burning sensation in her throat, burning sensation in her chest since the past 6 months.  She denies fever, chills, unintentional weight loss  nausea, vomiting, diarrhea, bloody stool, abdominal pain.  She has not taken any medication for her condition.  Sometimes has shortness of breath and dizziness when experiencing the burning sensation in her chest.   Interpretation services provided via [facility] health iPad  .   Review of Systems  Constitutional: Negative.   Respiratory: Negative.  Negative for cough, hemoptysis, sputum production and wheezing.   Cardiovascular: Negative.  Negative for palpitations, orthopnea, claudication, leg swelling and PND.  Gastrointestinal:  Positive for heartburn. Negative for abdominal pain, blood in stool, constipation, diarrhea, melena, nausea and vomiting.  Genitourinary: Negative.   Neurological:  Negative for speech change, focal weakness, seizures, loss of consciousness and weakness.  Psychiatric/Behavioral: Negative.          Objective:    BP 115/75   Pulse 71   Temp 97.8 F (36.6 C)   Ht 5\' 2"  (1.575 m)   Wt 170 lb 6.4 oz (77.3 kg)   LMP 01/12/2022 Comment: irregular  SpO2 97%   BMI 31.17 kg/m    Physical Exam Constitutional:      General: She is not in acute distress.    Appearance: She is obese. She is not  ill-appearing, toxic-appearing or diaphoretic.  Eyes:     General: No scleral icterus.       Right eye: No discharge.        Left eye: No discharge.     Extraocular Movements: Extraocular movements intact.  Cardiovascular:     Rate and Rhythm: Normal rate.     Pulses: Normal pulses.     Heart sounds: Normal heart sounds. No murmur heard.    No friction rub. No gallop.     Comments: Mild chest tenderness on palpation Pulmonary:     Effort: Pulmonary effort is normal. No respiratory distress.     Breath sounds: Normal breath sounds. No stridor. No wheezing, rhonchi or rales.  Chest:     Chest wall: No tenderness.  Abdominal:     General: There is no distension.     Palpations: Abdomen is soft. There is no mass.     Tenderness: There is no abdominal tenderness. There is no right CVA tenderness or guarding.  Musculoskeletal:        General: No tenderness, deformity or signs of injury.     Right lower leg: No edema.     Left lower leg: No edema.  Skin:    General: Skin is warm and dry.     Coloration: Skin is not jaundiced or pale.     Findings: No bruising or lesion.  Neurological:     Mental Status: She is alert and oriented to person, place, and time.  Motor: No weakness.     Coordination: Coordination normal.     Gait: Gait normal.  Psychiatric:        Behavior: Behavior normal.        Thought Content: Thought content normal.        Judgment: Judgment normal.     No results found for any visits on 06/29/22.      Assessment & Plan:   Problem List Items Addressed This Visit       Digestive   Gastroesophageal reflux disease - Primary    Start Protonix 40 mg daily Education on acid reflux provided Follow-up as planned      Relevant Medications   pantoprazole (PROTONIX) 40 MG tablet     Other   Chest pain    EKG shows normal sinus rhythm rate of 61 bpm I have low concern for ischemia Starting Protonix for acid reflux Take Tylenol 650 mg every 6 hours as  needed for chest tenderness.      Relevant Orders   EKG 12-Lead    Meds ordered this encounter  Medications   pantoprazole (PROTONIX) 40 MG tablet    Sig: Take 1 tablet (40 mg total) by mouth daily.    Dispense:  30 tablet    Refill:  3    Return in about 2 months (around 08/29/2022) for GERD.  Nicole Rival, FNP

## 2022-06-29 NOTE — Assessment & Plan Note (Signed)
Start Protonix 40 mg daily Education on acid reflux provided Follow-up as planned

## 2022-07-27 LAB — COLOGUARD: COLOGUARD: NEGATIVE

## 2022-07-30 NOTE — Progress Notes (Signed)
Called pt and inform results.Gh 

## 2022-08-17 ENCOUNTER — Ambulatory Visit: Payer: Self-pay | Admitting: Nurse Practitioner

## 2022-08-31 ENCOUNTER — Ambulatory Visit: Payer: Self-pay | Admitting: Nurse Practitioner

## 2023-04-15 ENCOUNTER — Other Ambulatory Visit: Payer: Self-pay

## 2023-04-15 ENCOUNTER — Emergency Department (HOSPITAL_COMMUNITY)
Admission: EM | Admit: 2023-04-15 | Discharge: 2023-04-16 | Disposition: A | Discharge status: Home / Self Care | Payer: Self-pay | Attending: Emergency Medicine | Admitting: Emergency Medicine

## 2023-04-15 ENCOUNTER — Encounter (HOSPITAL_COMMUNITY): Payer: Self-pay

## 2023-04-15 DIAGNOSIS — N390 Urinary tract infection, site not specified: Secondary | ICD-10-CM | POA: Insufficient documentation

## 2023-04-15 LAB — URINALYSIS, ROUTINE W REFLEX MICROSCOPIC
Bilirubin Urine: NEGATIVE
Glucose, UA: NEGATIVE mg/dL
Ketones, ur: NEGATIVE mg/dL
Nitrite: NEGATIVE
Protein, ur: NEGATIVE mg/dL
Specific Gravity, Urine: 1.002 — ABNORMAL LOW (ref 1.005–1.030)
WBC, UA: >50 WBC/hpf (ref 0–5)
pH: 6 (ref 5.0–8.0)

## 2023-04-15 LAB — PREGNANCY, URINE: Preg Test, Ur: NEGATIVE

## 2023-04-15 MED ORDER — CEPHALEXIN 500 MG PO CAPS
500.0000 mg | ORAL_CAPSULE | Freq: Once | ORAL | Status: AC
  Administered 2023-04-16: 500 mg via ORAL
  Filled 2023-04-15: qty 1

## 2023-04-15 MED ORDER — CEPHALEXIN 500 MG PO CAPS: 500.0000 mg | ORAL_CAPSULE | Freq: Three times a day (TID) | ORAL | 0 refills | Status: DC

## 2023-04-15 NOTE — ED Triage Notes (Signed)
 Pt reports dysuria and blood in urine that started around 2pm today. Pt denies abdominal pain or other symptoms.

## 2023-04-15 NOTE — ED Provider Notes (Signed)
 [employer] EMERGENCY DEPARTMENT AT [facility] Provider Note   CSN: [id number] Arrival date & time: 04/15/23  2257     History  Chief Complaint  Patient presents with   Dysuria    Nicole Page is a 48 y.o. female.  HPI     Spanish interpreter used.  This is a 48 year old female who presents with dysuria.  Patient reports onset of symptoms around 2 PM this afternoon.  She reports pain with urination and burning.  Has also noted some hematuria.  No back pain.  No fevers.  Denies nausea or vomiting or other symptoms.  Home Medications Prior to Admission medications   Medication Sig Start Date End Date Taking? Authorizing Provider  cephALEXin  (KEFLEX ) 500 MG capsule Take 1 capsule (500 mg total) by mouth 3 (three) times daily. 04/15/23  Yes Ovella Manygoats, Charmaine FALCON, MD  acetaminophen  (TYLENOL ) 500 MG tablet Take 1 tablet (500 mg total) by mouth every 6 (six) hours as needed. Patient not taking: Reported on 06/29/2022 12/03/13   Piepenbrink, Delon, PA-C  Chlorpheniramine Maleate (ALLERGY PO) Take 1 tablet by mouth once. Patient not taking: Reported on 06/29/2022    [provider]  Cholecalciferol (VITAMIN D -3) 25 MCG (1000 UT) CAPS Take by mouth. Patient not taking: Reported on 06/29/2022    [provider]  Multiple Vitamin (MULTIVITAMIN) LIQD Take 5 mLs by mouth daily.    [provider]  pantoprazole  (PROTONIX ) 40 MG tablet Take 1 tablet (40 mg total) by mouth daily. 06/29/22   Paseda, Folashade R, FNP      Allergies    Seasonal ic [octacosanol] and Penicillins    Review of Systems   Review of Systems  Constitutional:  Negative for fever.  Respiratory:  Negative for shortness of breath.   Cardiovascular:  Negative for chest pain.  Genitourinary:  Positive for dysuria and hematuria. Negative for flank pain.  All other systems reviewed and are negative.   Physical Exam Updated Vital Signs BP 131/88 (BP Location: Right Arm)    Pulse 76   Temp 98.2 F (36.8 C)   Resp 18   Ht 1.575 m (5' 2)   Wt 79.8 kg   LMP 01/12/2022 Comment: irregular  SpO2 100%   BMI 32.19 kg/m  Physical Exam Vitals and nursing note reviewed.  Constitutional:      Appearance: She is well-developed. She is not ill-appearing.  HENT:     Head: Normocephalic and atraumatic.  Eyes:     Pupils: Pupils are equal, round, and reactive to light.  Cardiovascular:     Rate and Rhythm: Normal rate and regular rhythm.     Heart sounds: Normal heart sounds.  Pulmonary:     Effort: Pulmonary effort is normal. No respiratory distress.     Breath sounds: No wheezing.  Abdominal:     Palpations: Abdomen is soft.     Tenderness: There is no abdominal tenderness. There is no guarding or rebound.  Musculoskeletal:     Cervical back: Neck supple.  Skin:    General: Skin is warm and dry.  Neurological:     Mental Status: She is alert and oriented to person, place, and time.  Psychiatric:        Mood and Affect: Mood normal.     ED Results / Procedures / Treatments   Labs (all labs ordered are listed, but only abnormal results are displayed) Labs Reviewed  URINALYSIS, ROUTINE W REFLEX MICROSCOPIC - Abnormal; Notable for the following components:  Result Value   Color, Urine STRAW (*)    APPearance HAZY (*)    Specific Gravity, Urine 1.002 (*)    Hgb urine dipstick LARGE (*)    Leukocytes,Ua LARGE (*)    Bacteria, UA RARE (*)    All other components within normal limits  URINE CULTURE  PREGNANCY, URINE    EKG None  Radiology No results found.  Procedures Procedures    Medications Ordered in ED Medications  cephALEXin  (KEFLEX ) capsule 500 mg (has no administration in time range)    ED Course/ Medical Decision Making/ A&P                                 Medical Decision Making Amount and/or Complexity of Data Reviewed Labs: ordered.  Risk Prescription drug management.   This patient presents to the ED for  concern of urinary symptoms, this involves an extensive number of treatment options, and is a complaint that carries with it a high risk of complications and morbidity.  I considered the following differential and admission for this acute, potentially life threatening condition.  The differential diagnosis includes lower urinary tract infection, pyelonephritis, interstitial cystitis  MDM:    This is a 48 year old female who presents with urinary symptoms.  She is nontoxic and vital signs are reassuring.  Denies any ascending symptoms including fever or back pain.  Urinalysis is consistent with likely UTI with large leukoesterase, greater than 50 white cells, rare bacteria.  This was cultured.  Patient was given Keflex .  Will discharge on Keflex .  She was given return precautions.  (Labs, imaging, consults)  Labs: I Ordered, and personally interpreted labs.  The pertinent results include: Urinalysis, urine pregnancy  Imaging Studies ordered: I ordered imaging studies including none I independently visualized and interpreted imaging. I agree with the radiologist interpretation  Additional history obtained from chart review.  External records from outside source obtained and reviewed including prior evaluations  Cardiac Monitoring: The patient was maintained on a cardiac monitor.  If on the cardiac monitor, I personally viewed and interpreted the cardiac monitored which showed an underlying rhythm of: Sinus  Reevaluation: After the interventions noted above, I reevaluated the patient and found that they have :stayed the same  Social Determinants of Health:  lives independently  Disposition: Discharge  Co morbidities that complicate the patient evaluation  Past Medical History:  Diagnosis Date   Elevated ALT measurement    Obesity    Prediabetes      Medicines Meds ordered this encounter  Medications   cephALEXin  (KEFLEX ) capsule 500 mg   cephALEXin  (KEFLEX ) 500 MG capsule     Sig: Take 1 capsule (500 mg total) by mouth 3 (three) times daily.    Dispense:  21 capsule    Refill:  0    I have reviewed the patients home medicines and have made adjustments as needed  Problem List / ED Course: Problem List Items Addressed This Visit   None Visit Diagnoses       Lower urinary tract infectious disease    -  Primary   Relevant Medications   cephALEXin  (KEFLEX ) capsule 500 mg (Start on 04/15/2023 11:45 PM)   cephALEXin  (KEFLEX ) 500 MG capsule                   Final Clinical Impression(s) / ED Diagnoses Final diagnoses:  Lower urinary tract infectious disease    Rx /  DC Orders ED Discharge Orders          Ordered    cephALEXin  (KEFLEX ) 500 MG capsule  3 times daily        04/15/23 2332              Bari Charmaine FALCON, MD 04/15/23 2333

## 2023-04-15 NOTE — Discharge Instructions (Addendum)
 You were seen today and found to have a urinary tract infection.  Take antibiotics as prescribed.  If you develop fevers or back pain, you should be reevaluated.  Lo atendieron hoy y descubrieron que tena una infeccin del tracto urinario.  Tome los antibiticos segn lo recetado.  Si presenta fiebre o dolor de [place], debe ser Early.

## 2023-04-17 LAB — URINE CULTURE: Culture: NO GROWTH

## 2024-04-18 ENCOUNTER — Encounter (HOSPITAL_COMMUNITY): Payer: Self-pay

## 2024-04-18 ENCOUNTER — Ambulatory Visit (HOSPITAL_COMMUNITY): Payer: Self-pay

## 2024-04-18 ENCOUNTER — Ambulatory Visit (HOSPITAL_COMMUNITY)
Admission: EM | Admit: 2024-04-18 | Discharge: 2024-04-18 | Disposition: A | Discharge status: Home / Self Care | Payer: Self-pay | Attending: Family Medicine | Admitting: Family Medicine

## 2024-04-18 DIAGNOSIS — R5383 Other fatigue: Secondary | ICD-10-CM | POA: Insufficient documentation

## 2024-04-18 DIAGNOSIS — G8929 Other chronic pain: Secondary | ICD-10-CM | POA: Insufficient documentation

## 2024-04-18 DIAGNOSIS — M542 Cervicalgia: Secondary | ICD-10-CM | POA: Insufficient documentation

## 2024-04-18 LAB — BASIC METABOLIC PANEL WITH GFR
Anion gap: 12 (ref 5–15)
BUN: 14 mg/dL (ref 6–20)
CO2: 25 mmol/L (ref 22–32)
Calcium: 9.7 mg/dL (ref 8.9–10.3)
Chloride: 105 mmol/L (ref 98–111)
Creatinine, Ser: 0.72 mg/dL (ref 0.44–1.00)
GFR, Estimated: >60 mL/min
Glucose, Bld: 113 mg/dL — ABNORMAL HIGH (ref 70–99)
Potassium: 4.1 mmol/L (ref 3.5–5.1)
Sodium: 142 mmol/L (ref 135–145)

## 2024-04-18 LAB — CBC
HCT: 41.2 % (ref 36.0–46.0)
Hemoglobin: 13.6 g/dL (ref 12.0–15.0)
MCH: 27 pg (ref 26.0–34.0)
MCHC: 33 g/dL (ref 30.0–36.0)
MCV: 81.7 fL (ref 80.0–100.0)
Platelets: 258 K/uL (ref 150–400)
RBC: 5.04 MIL/uL (ref 3.87–5.11)
RDW: 13.8 % (ref 11.5–15.5)
WBC: 12 K/uL — ABNORMAL HIGH (ref 4.0–10.5)
nRBC: 0 % (ref 0.0–0.2)

## 2024-04-18 LAB — TSH: TSH: 1.74 u[IU]/mL (ref 0.350–4.500)

## 2024-04-18 MED ORDER — PREDNISONE 20 MG PO TABS: 40.0000 mg | ORAL_TABLET | Freq: Every day | ORAL | 0 refills | Status: AC

## 2024-04-18 MED ORDER — METHOCARBAMOL 500 MG PO TABS: 500.0000 mg | ORAL_TABLET | Freq: Two times a day (BID) | ORAL | 0 refills | Status: AC | PRN

## 2024-04-18 NOTE — ED Triage Notes (Signed)
 Pt c/o neck pain for past few months. Denies injury. States pain has got worse in past 3 days causing weakness. States takes tylenol  sometimes with some relief.

## 2024-04-18 NOTE — Discharge Instructions (Signed)
 You have had labs (blood tests) sent today. We will call you with any significant abnormalities or if there is need to begin or change treatment or pursue further follow up.  You may also review your test results online through MyChart. If you do not have a MyChart account, instructions to sign up should be on your discharge paperwork.

## 2024-04-19 ENCOUNTER — Ambulatory Visit (HOSPITAL_COMMUNITY): Payer: Self-pay

## 2024-04-19 NOTE — ED Provider Notes (Signed)
 " [facility] CARE CENTER   [id number] 04/18/24 Arrival Time: 1525  ASSESSMENT & PLAN:  1. Chronic neck pain   2. Other fatigue    I have personally viewed and independently interpreted the imaging studies ordered this visit. C-spine: no acute changes.  No signs of meningitis.  For neck pain, trial of: Discharge Medication List as of 04/18/2024  6:52 PM     START taking these medications   Details  methocarbamol  (ROBAXIN ) 500 MG tablet Take 1-2 tablets (500-1,000 mg total) by mouth 2 (two) times daily as needed for muscle spasms., Starting Wed 04/18/2024, Normal    predniSONE  (DELTASONE ) 20 MG tablet Take 2 tablets (40 mg total) by mouth daily., Starting Wed 04/18/2024, Normal        Orders Placed This Encounter  Procedures   DG Cervical Spine Complete   Basic metabolic panel with GFR   CBC   TSH   Labs pending for reported fatigue.  Recommend:  Follow-up Information     Schedule an appointment as soon as possible for a visit  with Paseda, Folashade R, FNP.   Specialty: Nurse Practitioner Why: For follow up concerning your weakness/fatigue. Contact information: [address] [place] [address] [phone]         [facility].   Why: If your neck pain is worsening or failing to improve as anticipated. Contact information: [address] [place]  [address] [phone]               Reviewed expectations re: course of current medical issues. Questions answered. Outlined signs and symptoms indicating need for more acute intervention. Patient verbalized understanding. After Visit Summary given.  SUBJECTIVE: History from: patient and family. Daughter interpreting. Declines video interpreter. Nicole Page is a 49 y.o. female who reports neck pain for past few months. Denies injury. States pain has got worse in past 3 days. Also feeling fatigued. Denies fever/chills/MS changes.  Normal ambulation. Denies extremity sensation changes. Tylenol  with mild relief.   Past Surgical History:  Procedure Laterality Date   NO PAST SURGERIES        OBJECTIVE:  Vitals:   04/18/24 1716  BP: (!) 143/90  Pulse: 63  Resp: 18  Temp: 98.1 F (36.7 C)  TempSrc: Oral  SpO2: 100%    General appearance: alert; no distress HEENT: [place]; AT; PERRLA; EOMI Neck: supple with FROM but does reports soreness over cervical musculature bilaterally extending into bilateral trapezius muscles Resp: unlabored respirations Extremities: moves all without issue; no deformities CV: brisk extremity capillary refill of all extremities Skin: warm and dry; no visible rashes Neurologic: gait normal; normal sensation and strength of all extremities Psychological: alert and cooperative; normal mood and affect  Imaging: DG Cervical Spine Complete Result Date: 04/18/2024 CLINICAL DATA:  Nontraumatic neck pain 3 months. EXAM: CERVICAL SPINE - COMPLETE 4+ VIEW COMPARISON:  None Available. FINDINGS: Vertebral body alignment, heights and disc space heights are normal. Minimal spondylosis of the cervical spine to include mild facet arthropathy. No significant neural foraminal narrowing. Prevertebral soft tissues are normal. Atlantoaxial articulation is unremarkable. Remainder the exam is unremarkable. IMPRESSION: 1. No acute findings. 2. Minimal spondylosis of the cervical spine. Electronically Signed   By: Toribio Agreste M.D.   On: 04/18/2024 18:32      Allergies[1]  Past Medical History:  Diagnosis Date   Elevated ALT measurement    Obesity    Prediabetes    Social History  Socioeconomic History   Marital status: Single    Spouse name: Not on file   Number of children: 4   Years of education: Not on file   Highest education level: Not on file  Occupational History   Not on file  Tobacco Use   Smoking status: Never   Smokeless tobacco: Never  Vaping Use   Vaping status: Never Used   Substance and Sexual Activity   Alcohol use: No   Drug use: No   Sexual activity: Yes    Birth control/protection: Condom  Other Topics Concern   Not on file  Social History Narrative   Lives with her husband    Social Drivers of Health   Tobacco Use: Low Risk (04/18/2024)   Patient History    Smoking Tobacco Use: Never    Smokeless Tobacco Use: Never    Passive Exposure: Not on file  Financial Resource Strain: Not on file  Food Insecurity: Food Insecurity Present (06/29/2022)   Hunger Vital Sign    Worried About Running Out of Food in the Last Year: Sometimes true    Ran Out of Food in the Last Year: Sometimes true  Transportation Needs: No Transportation Needs (06/29/2022)   PRAPARE - Administrator, Civil Service (Medical): No    Lack of Transportation (Non-Medical): No  Physical Activity: Not on file  Stress: Not on file  Social Connections: Not on file  Depression (PHQ2-9): Low Risk (06/29/2022)   Depression (PHQ2-9)    PHQ-2 Score: 0  Alcohol Screen: Not on file  Housing: Low Risk (06/29/2022)   Housing    Last Housing Risk Score: 0  Utilities: Not At Risk (06/29/2022)   AHC Utilities    Threatened with loss of utilities: No  Health Literacy: Not on file   Family History  Problem Relation Age of Onset   CAD Mother    Hyperlipidemia Father    Cancer Father    Breast cancer Neg Hx    Past Surgical History:  Procedure Laterality Date   NO PAST SURGERIES          [1]  Allergies Allergen Reactions   Seasonal Ic [Octacosanol]    Penicillins Rash     Rolinda Rogue, MD 04/19/24 [phone]  "
